# Patient Record
Sex: Male | Born: 1970
Health system: Southern US, Community
[De-identification: ages and names within clinical notes are randomized; demographics above are authoritative.]

## PROBLEM LIST (undated history)

## (undated) DIAGNOSIS — T7840XA Allergy, unspecified, initial encounter: Secondary | ICD-10-CM

## (undated) DIAGNOSIS — K219 Gastro-esophageal reflux disease without esophagitis: Secondary | ICD-10-CM

## (undated) HISTORY — DX: Allergy, unspecified, initial encounter: T78.40XA

## (undated) HISTORY — PX: CHOLECYSTECTOMY: SHX55

## (undated) HISTORY — DX: Gastro-esophageal reflux disease without esophagitis: K21.9

---

## 1999-05-29 ENCOUNTER — Emergency Department (HOSPITAL_COMMUNITY): Admission: EM | Admit: 1999-05-29 | Discharge: 1999-05-29 | Payer: Self-pay | Admitting: Emergency Medicine

## 2004-09-11 ENCOUNTER — Ambulatory Visit: Payer: Self-pay

## 2006-03-11 ENCOUNTER — Encounter: Payer: Self-pay | Admitting: Emergency Medicine

## 2010-01-09 DIAGNOSIS — J309 Allergic rhinitis, unspecified: Secondary | ICD-10-CM | POA: Insufficient documentation

## 2018-07-05 DIAGNOSIS — L573 Poikiloderma of Civatte: Secondary | ICD-10-CM | POA: Diagnosis not present

## 2018-07-05 DIAGNOSIS — D224 Melanocytic nevi of scalp and neck: Secondary | ICD-10-CM | POA: Diagnosis not present

## 2018-10-01 DIAGNOSIS — Z13220 Encounter for screening for lipoid disorders: Secondary | ICD-10-CM | POA: Diagnosis not present

## 2018-10-01 DIAGNOSIS — Z0000 Encounter for general adult medical examination without abnormal findings: Secondary | ICD-10-CM | POA: Diagnosis not present

## 2019-05-03 ENCOUNTER — Other Ambulatory Visit: Payer: Self-pay

## 2019-05-03 ENCOUNTER — Other Ambulatory Visit: Payer: Self-pay | Admitting: Family Medicine

## 2019-05-03 ENCOUNTER — Ambulatory Visit
Admission: RE | Admit: 2019-05-03 | Discharge: 2019-05-03 | Disposition: A | Discharge status: Home / Self Care | Payer: Self-pay | Source: Ambulatory Visit | Attending: Family Medicine | Admitting: Family Medicine

## 2019-05-03 DIAGNOSIS — M545 Low back pain, unspecified: Secondary | ICD-10-CM

## 2019-05-03 DIAGNOSIS — M5416 Radiculopathy, lumbar region: Secondary | ICD-10-CM | POA: Diagnosis not present

## 2019-08-12 DIAGNOSIS — E559 Vitamin D deficiency, unspecified: Secondary | ICD-10-CM | POA: Diagnosis not present

## 2019-08-12 DIAGNOSIS — M5441 Lumbago with sciatica, right side: Secondary | ICD-10-CM | POA: Diagnosis not present

## 2019-08-12 DIAGNOSIS — Z13220 Encounter for screening for lipoid disorders: Secondary | ICD-10-CM | POA: Diagnosis not present

## 2019-08-12 DIAGNOSIS — Z125 Encounter for screening for malignant neoplasm of prostate: Secondary | ICD-10-CM | POA: Diagnosis not present

## 2019-08-12 DIAGNOSIS — Z0000 Encounter for general adult medical examination without abnormal findings: Secondary | ICD-10-CM | POA: Diagnosis not present

## 2019-09-01 ENCOUNTER — Other Ambulatory Visit: Payer: Self-pay | Admitting: Family Medicine

## 2019-09-01 ENCOUNTER — Ambulatory Visit
Admission: RE | Admit: 2019-09-01 | Discharge: 2019-09-01 | Disposition: A | Discharge status: Home / Self Care | Payer: BC Managed Care – PPO | Source: Ambulatory Visit | Attending: Family Medicine | Admitting: Family Medicine

## 2019-09-01 DIAGNOSIS — M5441 Lumbago with sciatica, right side: Secondary | ICD-10-CM

## 2019-09-01 DIAGNOSIS — M545 Low back pain: Secondary | ICD-10-CM | POA: Diagnosis not present

## 2020-05-29 DIAGNOSIS — M6740 Ganglion, unspecified site: Secondary | ICD-10-CM | POA: Diagnosis not present

## 2020-05-29 DIAGNOSIS — Z1211 Encounter for screening for malignant neoplasm of colon: Secondary | ICD-10-CM | POA: Diagnosis not present

## 2020-05-29 DIAGNOSIS — R1011 Right upper quadrant pain: Secondary | ICD-10-CM | POA: Diagnosis not present

## 2020-05-30 ENCOUNTER — Other Ambulatory Visit: Payer: Self-pay | Admitting: Family Medicine

## 2020-05-30 DIAGNOSIS — R1011 Right upper quadrant pain: Secondary | ICD-10-CM

## 2020-06-06 ENCOUNTER — Ambulatory Visit
Admission: RE | Admit: 2020-06-06 | Discharge: 2020-06-06 | Disposition: A | Discharge status: Home / Self Care | Payer: BC Managed Care – PPO | Source: Ambulatory Visit | Attending: Family Medicine | Admitting: Family Medicine

## 2020-06-06 ENCOUNTER — Other Ambulatory Visit: Payer: Self-pay

## 2020-06-06 DIAGNOSIS — K8020 Calculus of gallbladder without cholecystitis without obstruction: Secondary | ICD-10-CM | POA: Diagnosis not present

## 2020-06-06 DIAGNOSIS — R1011 Right upper quadrant pain: Secondary | ICD-10-CM

## 2020-06-06 DIAGNOSIS — Q600 Renal agenesis, unilateral: Secondary | ICD-10-CM | POA: Diagnosis not present

## 2020-06-06 DIAGNOSIS — N281 Cyst of kidney, acquired: Secondary | ICD-10-CM | POA: Diagnosis not present

## 2020-06-13 ENCOUNTER — Other Ambulatory Visit: Payer: Self-pay

## 2020-06-13 ENCOUNTER — Ambulatory Visit (INDEPENDENT_AMBULATORY_CARE_PROVIDER_SITE_OTHER): Payer: BC Managed Care – PPO | Admitting: Podiatry

## 2020-06-13 ENCOUNTER — Ambulatory Visit (INDEPENDENT_AMBULATORY_CARE_PROVIDER_SITE_OTHER): Payer: BC Managed Care – PPO

## 2020-06-13 ENCOUNTER — Encounter: Payer: Self-pay | Admitting: Podiatry

## 2020-06-13 VITALS — BP 136/81 | HR 65 | Temp 98.3°F | Resp 16

## 2020-06-13 DIAGNOSIS — M67471 Ganglion, right ankle and foot: Secondary | ICD-10-CM

## 2020-06-13 NOTE — Progress Notes (Signed)
  Subjective:  Patient ID: Jorge Rush, male    DOB: 1971-02-14,  MRN: [id number] HPI Chief Complaint  Patient presents with  . Foot Pain    "A little knot came up on my right ankle.  It showed up three to four months ago.  It doesn't really bother me.  I noticed it when I started to wear my steel toe shoes.  I loosened the strings up, that seemed to help."    49 y.o. male presents with the above complaint.   ROS: Denies fever chills nausea vomiting muscle aches pains calf pain back pain chest pain shortness of breath.  No past medical history on file.  No current outpatient medications on file.  Allergies  Allergen Reactions  . Sulfa Antibiotics Rash   Review of Systems Objective:   Vitals:   06/13/20 1632  BP: 136/81  Pulse: 65  Resp: 16  Temp: 98.3 F (36.8 C)    General: Well developed, nourished, in no acute distress, alert and oriented x3   Dermatological: Skin is warm, dry and supple bilateral. Nails x 10 are well maintained; remaining integument appears unremarkable at this time. There are no open sores, no preulcerative lesions, no rash or signs of infection present.  Soft tissue mass measuring a little bit greater than 1 cm in diameter just overlying the sinus tarsi and overlying that is the intermediate dorsal cutaneous nerve.  This appeared to be fluctuant nonpulsatile mass.  Vascular: Dorsalis Pedis artery and Posterior Tibial artery pedal pulses are 2/4 bilateral with immedate capillary fill time. Pedal hair growth present. No varicosities and no lower extremity edema present bilateral.   Neruologic: Grossly intact via light touch bilateral. Vibratory intact via tuning fork bilateral. Protective threshold with Semmes Wienstein monofilament intact to all pedal sites bilateral. Patellar and Achilles deep tendon reflexes 2+ bilateral. No Babinski or clonus noted bilateral.   Musculoskeletal: No gross boney pedal deformities bilateral. No pain, crepitus, or  limitation noted with foot and ankle range of motion bilateral. Muscular strength 5/5 in all groups tested bilateral.  Gait: Unassisted, Nonantalgic.    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  It appears that there may have been damage to the subtalar joint at some point anteriorly.  No calcification of the mass on x-ray. Assessment & Plan:   Assessment: Ganglion cyst anterior lateral ankle superior to the subtalar joint sinus tarsi area.  Plan: Discussed etiology pathology conservative versus surgical therapies.  At this point after a small amount of local anesthetic was injected I was able to aspirate just a little more than 1 cc of normal appearing joint fluid.  I then introduced 10 mg of Kenalog and a dry sterile compressive dressing.     Kemaya Dorner T. [place], [place]

## 2020-07-20 DIAGNOSIS — K8020 Calculus of gallbladder without cholecystitis without obstruction: Secondary | ICD-10-CM | POA: Diagnosis not present

## 2020-08-29 DIAGNOSIS — Z0000 Encounter for general adult medical examination without abnormal findings: Secondary | ICD-10-CM | POA: Diagnosis not present

## 2020-08-29 DIAGNOSIS — Z13220 Encounter for screening for lipoid disorders: Secondary | ICD-10-CM | POA: Diagnosis not present

## 2020-08-29 DIAGNOSIS — Z125 Encounter for screening for malignant neoplasm of prostate: Secondary | ICD-10-CM | POA: Diagnosis not present

## 2020-08-29 DIAGNOSIS — K8020 Calculus of gallbladder without cholecystitis without obstruction: Secondary | ICD-10-CM | POA: Diagnosis not present

## 2021-01-15 DIAGNOSIS — Z01818 Encounter for other preprocedural examination: Secondary | ICD-10-CM | POA: Diagnosis not present

## 2021-01-16 DIAGNOSIS — K8020 Calculus of gallbladder without cholecystitis without obstruction: Secondary | ICD-10-CM | POA: Diagnosis not present

## 2021-01-18 DIAGNOSIS — K8020 Calculus of gallbladder without cholecystitis without obstruction: Secondary | ICD-10-CM | POA: Diagnosis not present

## 2021-01-18 DIAGNOSIS — K8010 Calculus of gallbladder with chronic cholecystitis without obstruction: Secondary | ICD-10-CM | POA: Diagnosis not present

## 2021-03-17 DIAGNOSIS — Z20822 Contact with and (suspected) exposure to covid-19: Secondary | ICD-10-CM | POA: Diagnosis not present

## 2021-06-16 IMAGING — DX DG LUMBAR SPINE COMPLETE 4+V
5 series · 5 of 5 positions shown · non-contrast
Comparison: May 03, 2019

CLINICAL DATA: Lumbago with right-sided sciatica

EXAM:
LUMBAR SPINE - COMPLETE 4+ VIEW

[dg lumbar spine complete 4 +v (1 of 5)]
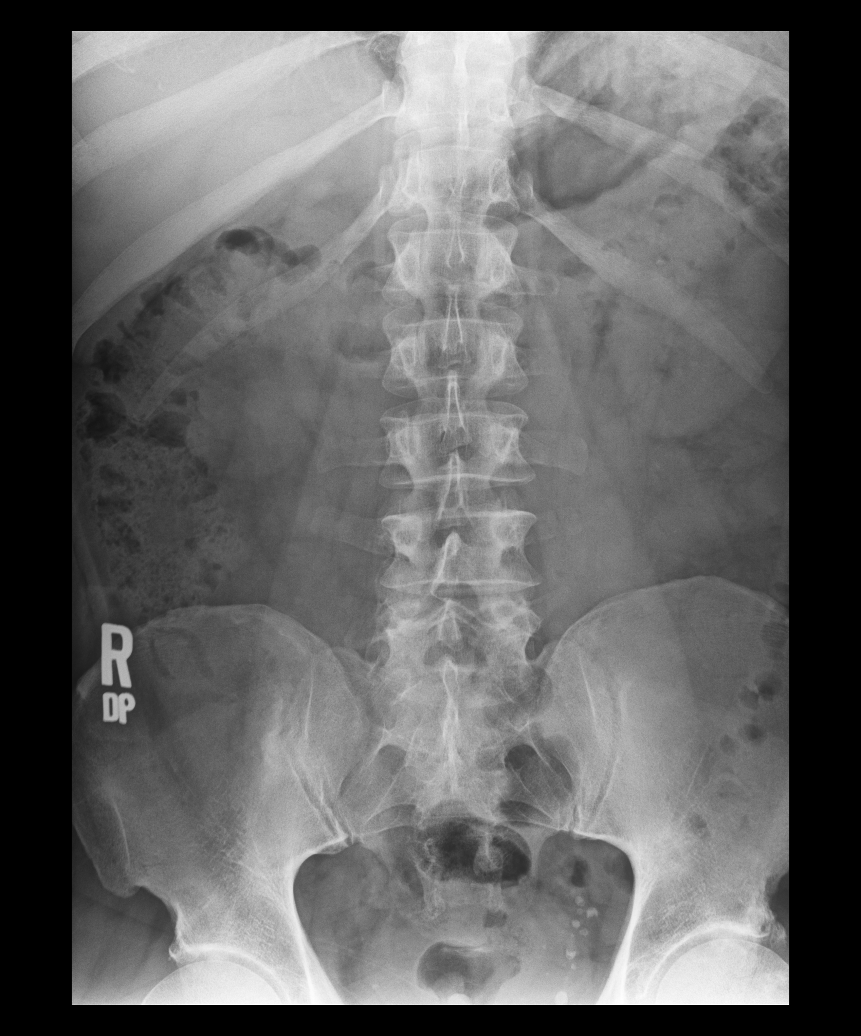

[dg lumbar spine complete 4 +v (2 of 5)]
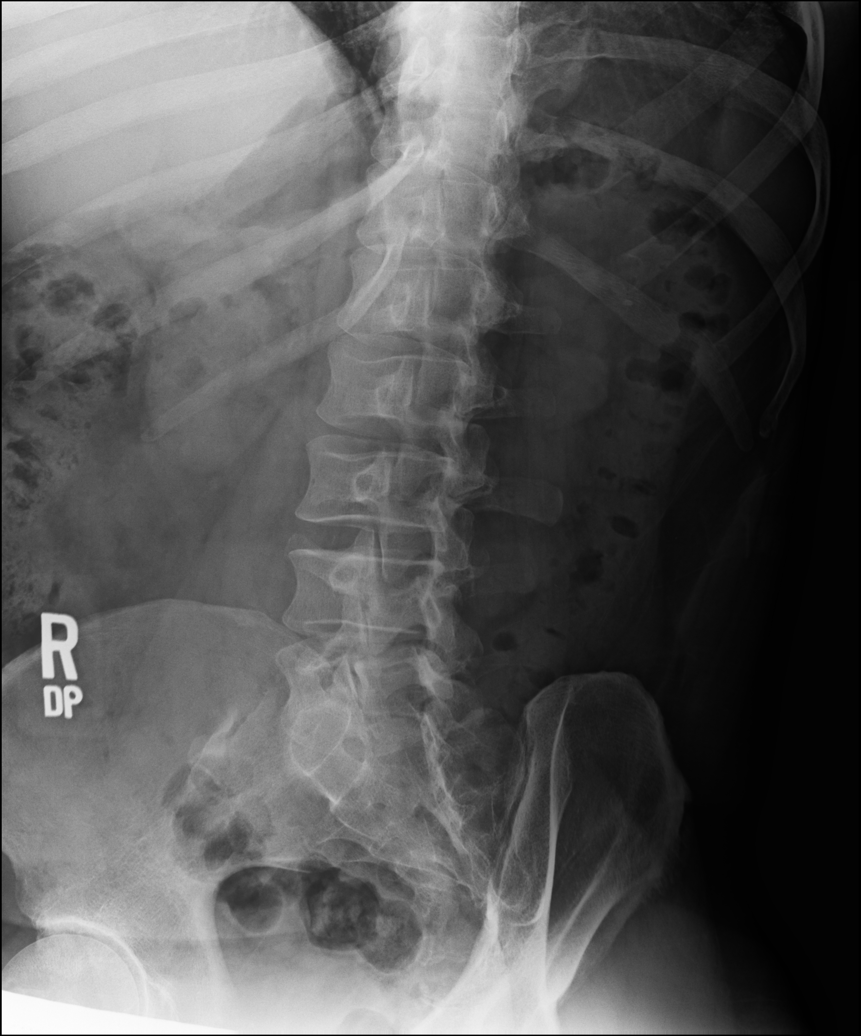

[dg lumbar spine complete 4 +v (3 of 5)]
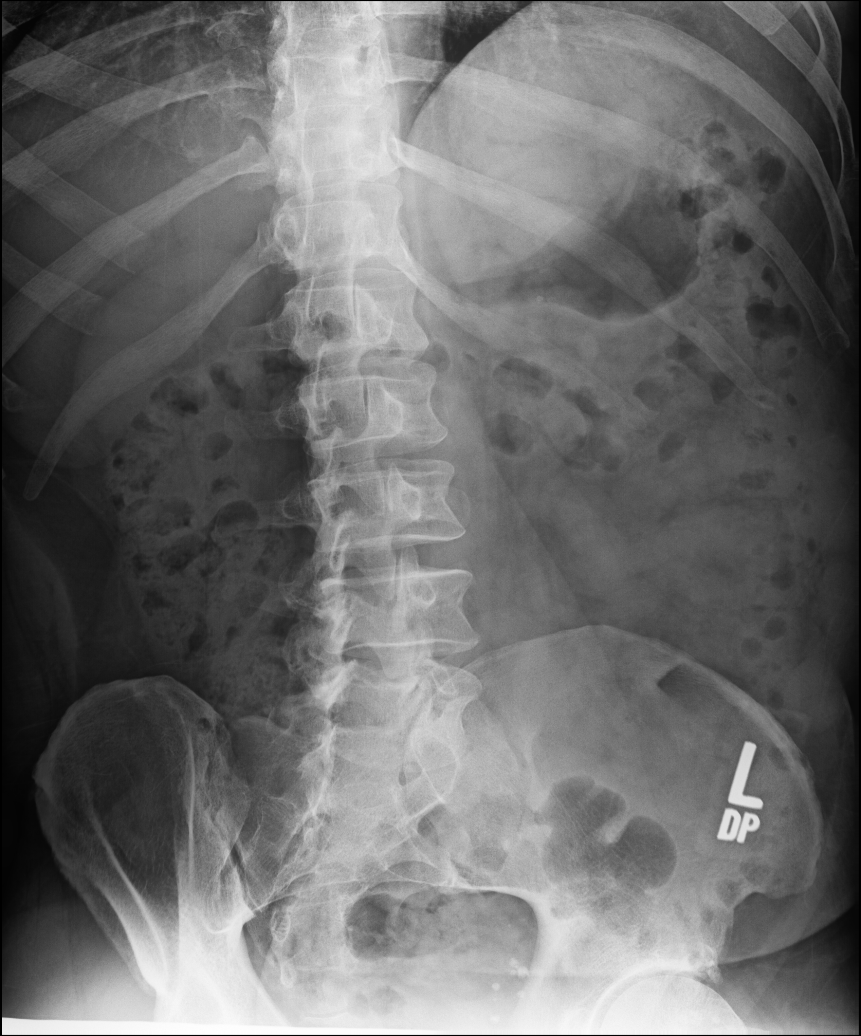

[dg lumbar spine complete 4 +v (4 of 5)]
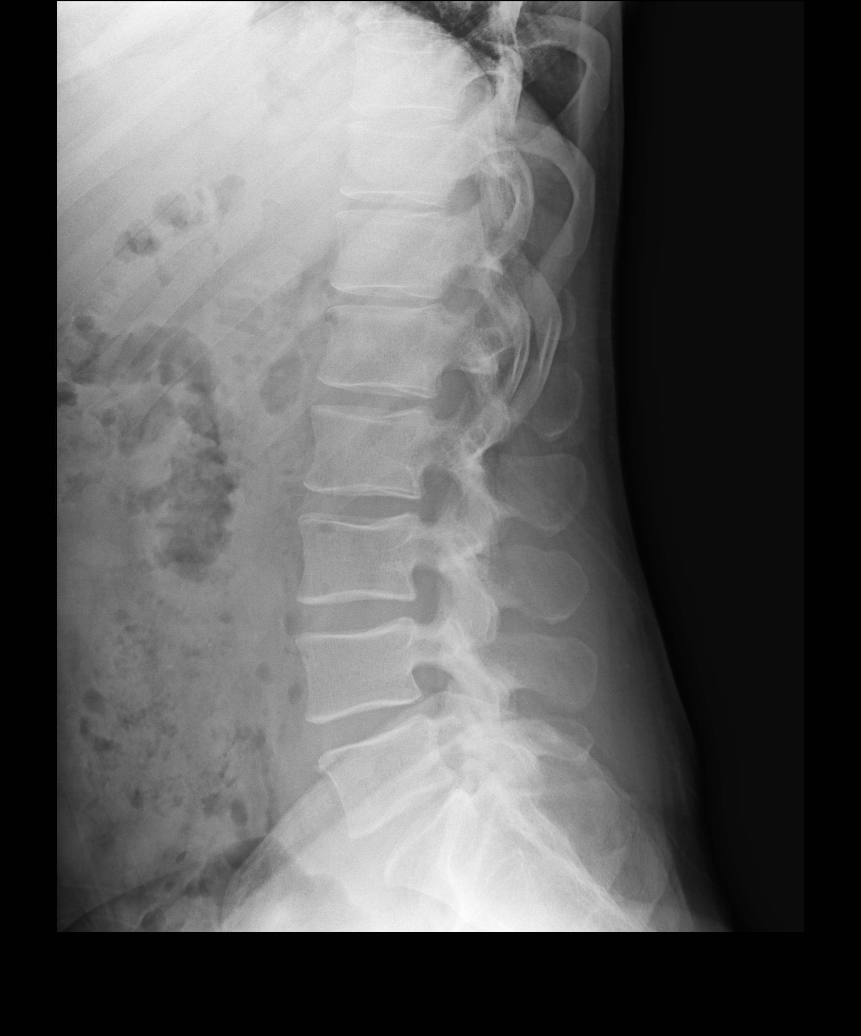

[dg lumbar spine complete 4 +v (5 of 5)]
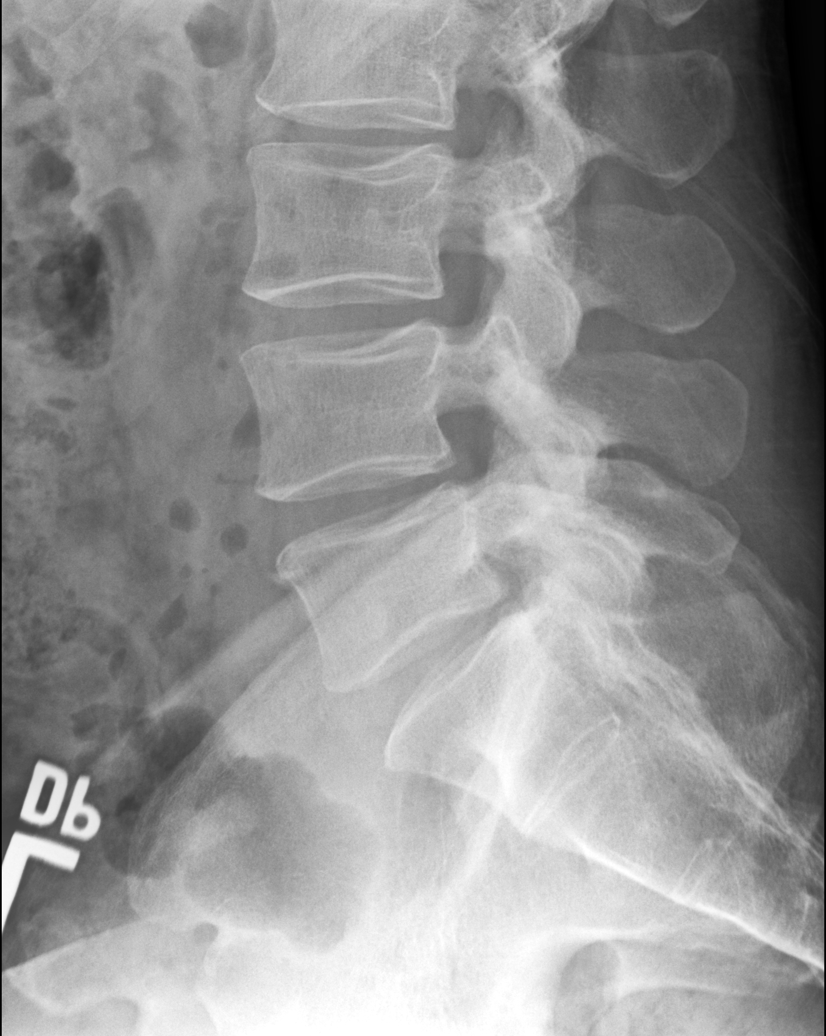

[5 of 5 positions shown; findings below may reference images not displayed]

FINDINGS: Frontal, lateral, spot lumbosacral lateral, and bilateral oblique
views were obtained. There are 5 non-rib-bearing lumbar type
vertebral bodies. There is no evident fracture or spondylolisthesis.
Disc spaces appear unremarkable. There is a pars defect on the right
at L5-S1, also present on prior study. No appreciable facet
arthropathy evident.
IMPRESSION: There is a pars defect at L5 on the right without spondylolisthesis.
No fracture. No appreciable underlying arthropathy.

## 2021-09-10 DIAGNOSIS — Z0000 Encounter for general adult medical examination without abnormal findings: Secondary | ICD-10-CM | POA: Diagnosis not present

## 2021-09-10 DIAGNOSIS — Z13220 Encounter for screening for lipoid disorders: Secondary | ICD-10-CM | POA: Diagnosis not present

## 2021-09-10 DIAGNOSIS — Z125 Encounter for screening for malignant neoplasm of prostate: Secondary | ICD-10-CM | POA: Diagnosis not present

## 2022-03-22 IMAGING — US US ABDOMEN COMPLETE
1 series · 13 of 25 positions shown · non-contrast
Comparison: None

CLINICAL DATA: RIGHT upper quadrant pain for 1 week after eating
fatty foods

EXAM:
ABDOMEN ULTRASOUND COMPLETE

[Series 1: us abdomen complete · 0.25mm/px · 13 of 112 slices shown]
[im 1/112]
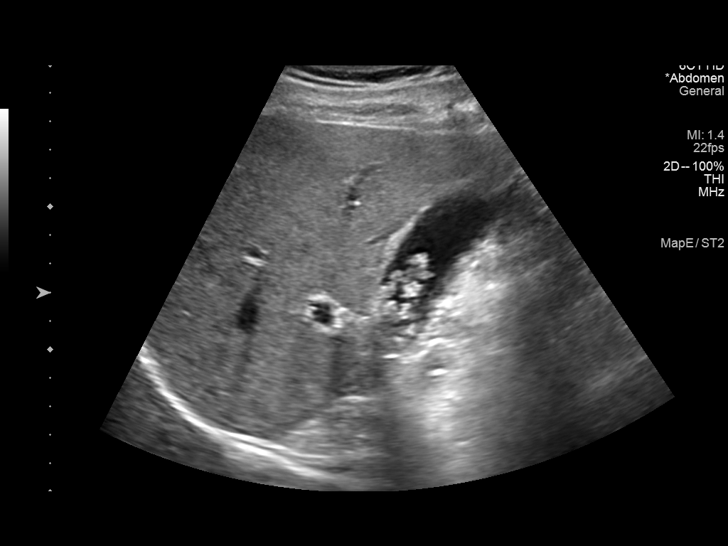
[im 10/112]
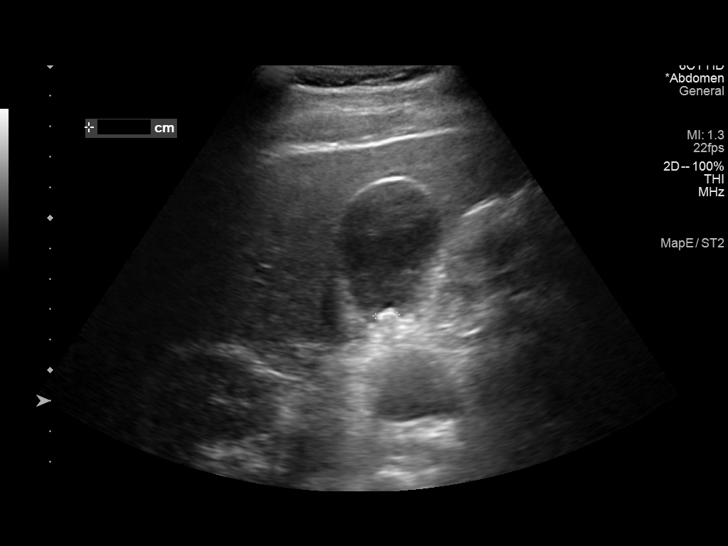
[im 19/112]
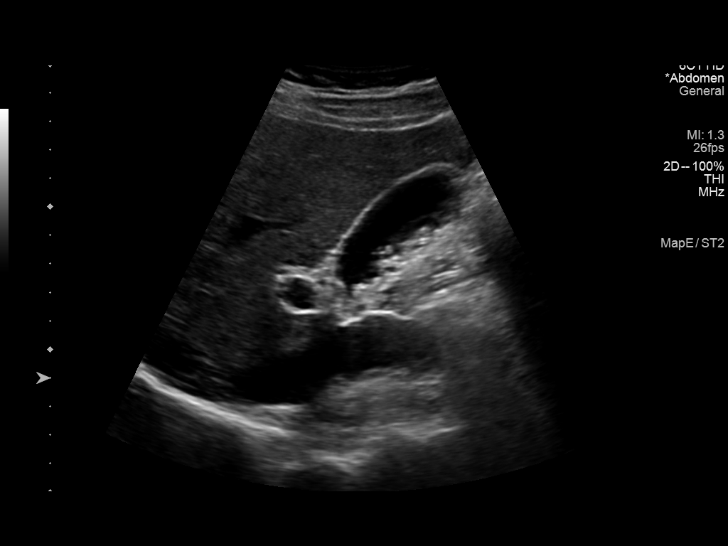
[im 28/112]
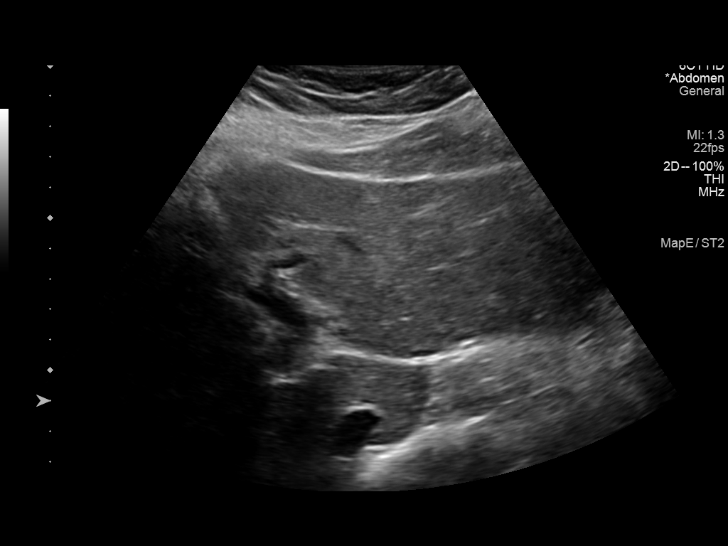
[im 38/112]
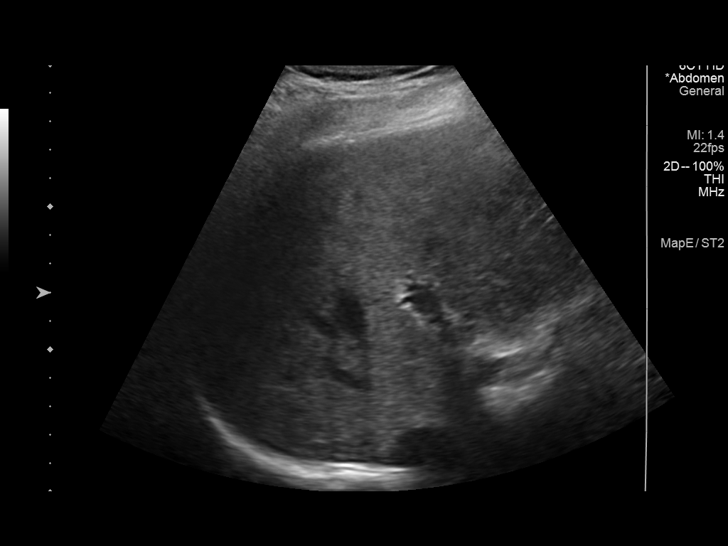
[im 47/112]
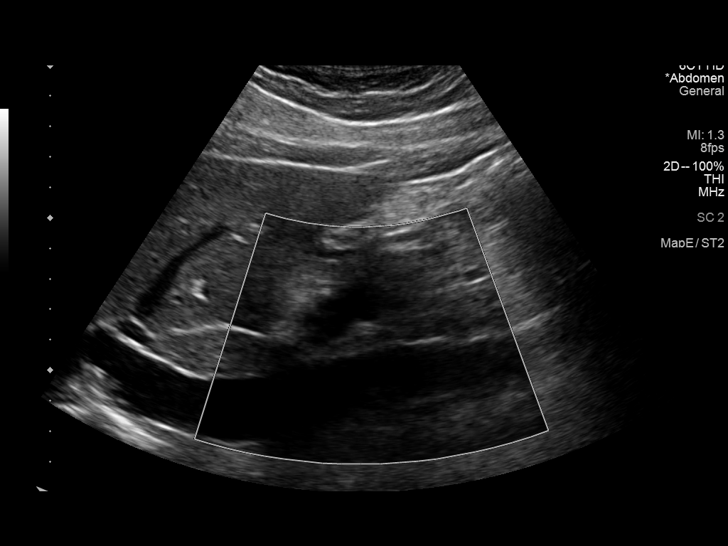
[im 56/112]
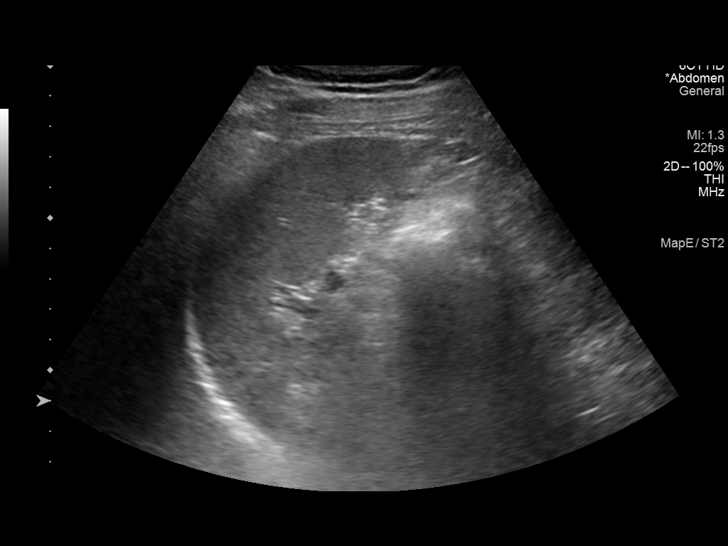
[im 65/112]
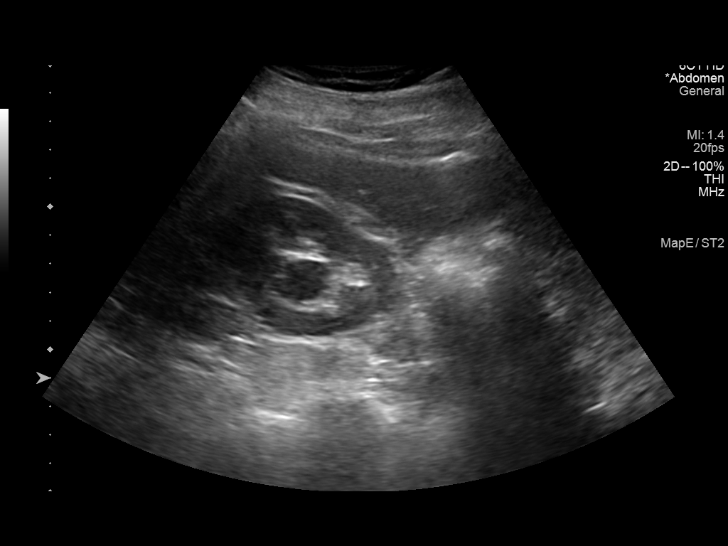
[im 75/112]
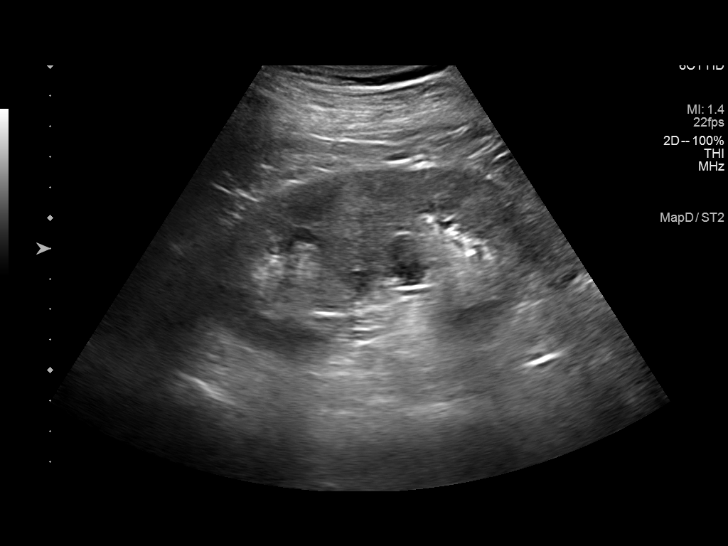
[im 84/112]
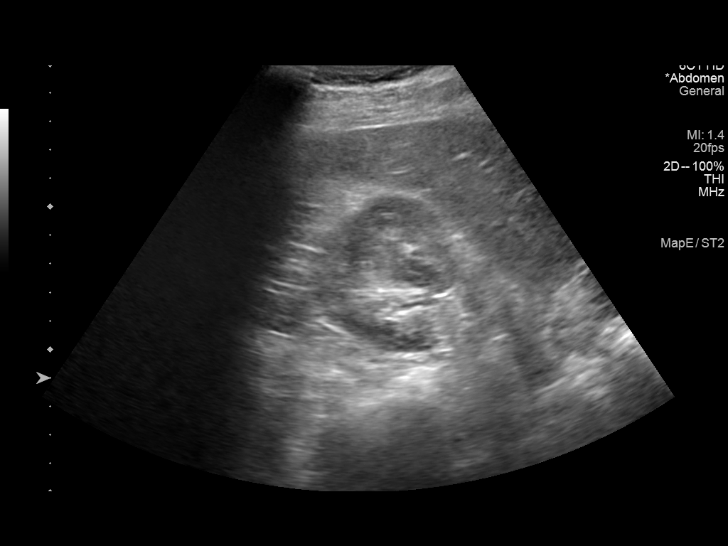
[im 93/112]
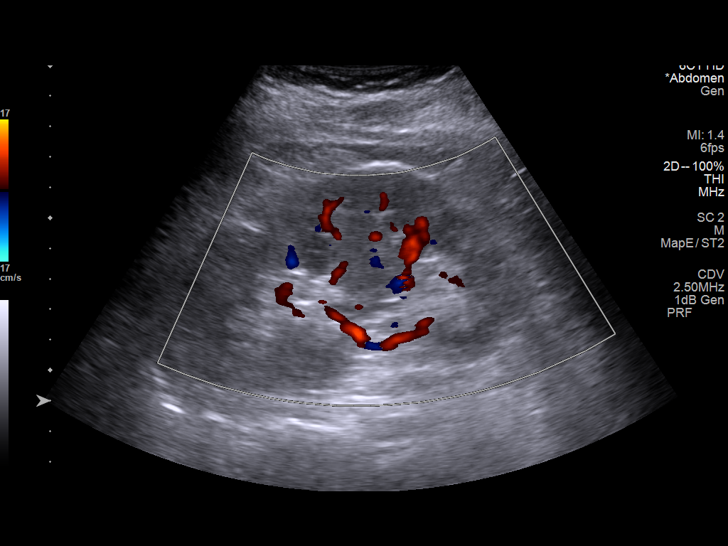
[im 102/112]
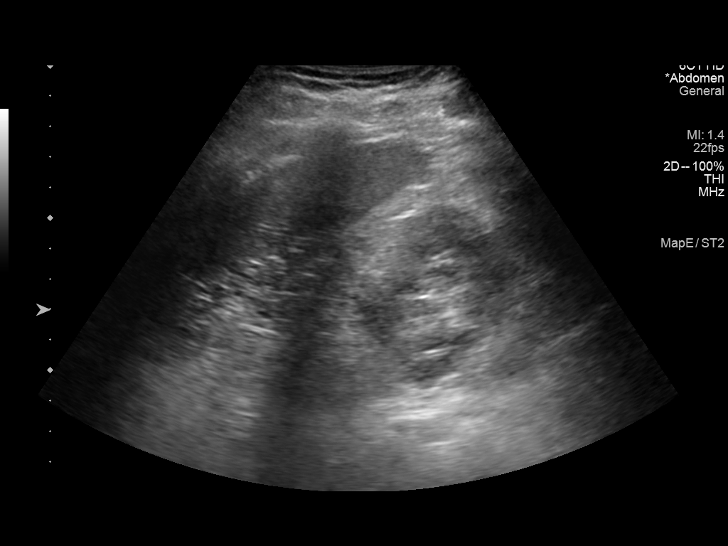
[im 112/112]
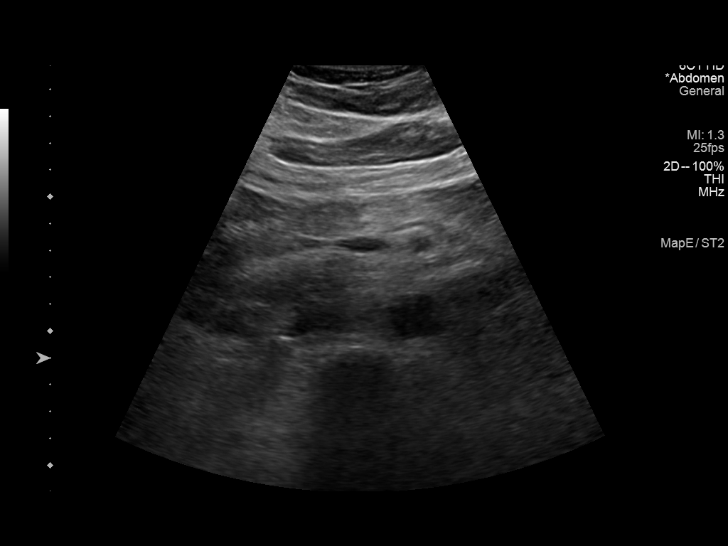

[13 of 25 positions shown; findings below may reference images not displayed]

FINDINGS: Gallbladder: Multiple shadowing calculi in gallbladder up to 7 mm
diameter. No gallbladder wall thickening, pericholecystic fluid or
sonographic Murphy sign

Common bile duct: Diameter: 3 mm, normal

Liver: Normal echogenicity without mass or nodularity. No
intrahepatic biliary dilatation. Portal vein is patent on color
Doppler imaging with normal direction of blood flow towards the
liver.

IVC: Normal appearance

Pancreas: Portions of head and distal tail obscured by bowel gas,
visualized portions normal appearance

Spleen: Normal appearance, 7.5 cm length

Right Kidney: Length: 11.9 cm. Normal cortical thickness and
echogenicity. Peripelvic cyst mid kidney 1.7 x 1.7 x 1.5 cm. No
additional masses or hydronephrosis.

Left Kidney: Length: 12.6 cm. Normal morphology without mass or
hydronephrosis.

Abdominal aorta: Normal caliber

Other findings: No free fluid
IMPRESSION: Cholelithiasis without evidence acute cholecystitis.

1.7 cm peripelvic cyst mid RIGHT kidney.

Incomplete pancreatic visualization.

Remainder of exam unremarkable.

## 2022-05-26 DIAGNOSIS — K635 Polyp of colon: Secondary | ICD-10-CM | POA: Diagnosis not present

## 2022-05-26 DIAGNOSIS — Z1211 Encounter for screening for malignant neoplasm of colon: Secondary | ICD-10-CM | POA: Diagnosis not present

## 2022-05-26 DIAGNOSIS — K5730 Diverticulosis of large intestine without perforation or abscess without bleeding: Secondary | ICD-10-CM | POA: Diagnosis not present

## 2022-05-26 DIAGNOSIS — K648 Other hemorrhoids: Secondary | ICD-10-CM | POA: Diagnosis not present

## 2022-07-03 NOTE — Progress Notes (Unsigned)
   I,Jana Rithwik Schmieg,acting as a Neurosurgeon for [employer], PA-C.,have documented all relevant documentation on the behalf of Debera Lat, PA-C,as directed by  [employer], PA-C while in the presence of [employer], PA-C.  New Patient Office Visit  Subjective    Patient ID: Jorge Rush, male    DOB: December 23, 1970  Age: 51 y.o. MRN: [id number]  CC:  Chief Complaint  Patient presents with   Establish Care    HPI Jorge Rush is a 51 yr old male presenting to establish care.   Wife scheduled appt for patient due to his snoring.  Feels tired and ahs been having puffiness  Patient points to area at upper abdomin that he states "feels weird." Patient can't describe the feeling.  Occurs off and on and mostly in the morning.  States he had a colonoscopy in July '23 and was dx with diverticulitis. Wonders if related.  Otherwise, patient states he feels great.  Cholestectomy last year  No outpatient encounter medications on file as of 07/04/2022.   No facility-administered encounter medications on file as of 07/04/2022.    History reviewed. No pertinent past medical history.  Past Surgical History:  Procedure Laterality Date   CHOLECYSTECTOMY      Family History  Problem Relation Age of Onset   Hypertension Mother    Breast cancer Maternal Grandmother    Heart disease Maternal Grandfather     Social History   Socioeconomic History   Marital status: Married    Spouse name: Not on file   Number of children: Not on file   Years of education: Not on file   Highest education level: Not on file  Occupational History   Not on file  Tobacco Use   Smoking status: Every Day    Packs/day: 0.50    Types: Cigarettes   Smokeless tobacco: Never  Substance and Sexual Activity   Alcohol use: Yes    Alcohol/week: 26.0 standard drinks of alcohol    Types: 3 Cans of beer, 10 Shots of liquor, 13 Standard drinks or equivalent per week    Comment: OCCASIONALLY   Drug use: Yes    Types:  Marijuana    Comment: OCCASIONALLY   Sexual activity: Not on file  Other Topics Concern   Not on file  Social History Narrative   Not on file   Social Determinants of Health   Financial Resource Strain: Not on file  Food Insecurity: Not on file  Transportation Needs: Not on file  Physical Activity: Not on file  Stress: Not on file  Social Connections: Not on file  Intimate Partner Violence: Not on file    Review of Systems  Endo/Heme/Allergies:  Positive for environmental allergies.  All other systems reviewed and are negative.       Objective    BP 127/77 (BP Location: Right Arm, Patient Position: Sitting, Cuff Size: Normal)   Pulse 67   Temp 98.5 F (36.9 C) (Oral)   Resp 16   Ht 5\' 10"  (1.778 m)   Wt 187 lb 4.8 oz (85 kg)   SpO2 97%   BMI 26.87 kg/m   Physical Exam  {Labs (Optional):23779}    Assessment & Plan:   Problem List Items Addressed This Visit   None Visit Diagnoses     Establishing care with new doctor, encounter for    -  Primary       No follow-ups on file.   , CMA

## 2022-07-04 ENCOUNTER — Ambulatory Visit (INDEPENDENT_AMBULATORY_CARE_PROVIDER_SITE_OTHER): Payer: BC Managed Care – PPO | Admitting: Physician Assistant

## 2022-07-04 ENCOUNTER — Encounter: Payer: Self-pay | Admitting: Physician Assistant

## 2022-07-04 VITALS — BP 127/77 | HR 67 | Temp 98.5°F | Resp 16 | Ht 70.0 in | Wt 187.3 lb

## 2022-07-04 DIAGNOSIS — F17200 Nicotine dependence, unspecified, uncomplicated: Secondary | ICD-10-CM

## 2022-07-04 DIAGNOSIS — R0683 Snoring: Secondary | ICD-10-CM

## 2022-07-04 DIAGNOSIS — R5383 Other fatigue: Secondary | ICD-10-CM

## 2022-07-04 DIAGNOSIS — Z7689 Persons encountering health services in other specified circumstances: Secondary | ICD-10-CM

## 2022-07-04 DIAGNOSIS — K219 Gastro-esophageal reflux disease without esophagitis: Secondary | ICD-10-CM

## 2022-07-04 DIAGNOSIS — R1031 Right lower quadrant pain: Secondary | ICD-10-CM

## 2022-07-04 MED ORDER — OMEPRAZOLE 20 MG PO CPDR: 20.0000 mg | DELAYED_RELEASE_CAPSULE | Freq: Every day | ORAL | 3 refills | Status: DC

## 2022-07-05 LAB — LIPID PANEL
Chol/HDL Ratio: 4.5 ratio (ref 0.0–5.0)
Cholesterol, Total: 165 mg/dL (ref 100–199)
HDL: 37 mg/dL — ABNORMAL LOW (ref >39)
LDL Chol Calc (NIH): 101 mg/dL — ABNORMAL HIGH (ref 0–99)
Triglycerides: 150 mg/dL — ABNORMAL HIGH (ref 0–149)
VLDL Cholesterol Cal: 27 mg/dL (ref 5–40)

## 2022-07-05 LAB — CBC WITH DIFFERENTIAL/PLATELET
Basophils Absolute: 0.1 10*3/uL (ref 0.0–0.2)
Basos: 1 %
EOS (ABSOLUTE): 0.4 10*3/uL (ref 0.0–0.4)
Eos: 7 %
Hematocrit: 44.8 % (ref 37.5–51.0)
Hemoglobin: 14.8 g/dL (ref 13.0–17.7)
Immature Grans (Abs): 0 10*3/uL (ref 0.0–0.1)
Immature Granulocytes: 0 %
Lymphocytes Absolute: 1.9 10*3/uL (ref 0.7–3.1)
Lymphs: 33 %
MCH: 28.1 pg (ref 26.6–33.0)
MCHC: 33 g/dL (ref 31.5–35.7)
MCV: 85 fL (ref 79–97)
Monocytes Absolute: 0.4 10*3/uL (ref 0.1–0.9)
Monocytes: 7 %
Neutrophils Absolute: 3.1 10*3/uL (ref 1.4–7.0)
Neutrophils: 52 %
Platelets: 253 10*3/uL (ref 150–450)
RBC: 5.26 x10E6/uL (ref 4.14–5.80)
RDW: 12.8 % (ref 11.6–15.4)
WBC: 5.9 10*3/uL (ref 3.4–10.8)

## 2022-07-05 LAB — COMPREHENSIVE METABOLIC PANEL
ALT: 19 IU/L (ref 0–44)
AST: 17 IU/L (ref 0–40)
Albumin/Globulin Ratio: 1.7 (ref 1.2–2.2)
Albumin: 4.7 g/dL (ref 3.8–4.9)
Alkaline Phosphatase: 82 IU/L (ref 44–121)
BUN/Creatinine Ratio: 18 (ref 9–20)
BUN: 15 mg/dL (ref 6–24)
Bilirubin Total: 0.5 mg/dL (ref 0.0–1.2)
CO2: 22 mmol/L (ref 20–29)
Calcium: 9.4 mg/dL (ref 8.7–10.2)
Chloride: 104 mmol/L (ref 96–106)
Creatinine, Ser: 0.85 mg/dL (ref 0.76–1.27)
Globulin, Total: 2.7 g/dL (ref 1.5–4.5)
Glucose: 92 mg/dL (ref 70–99)
Potassium: 4.2 mmol/L (ref 3.5–5.2)
Sodium: 141 mmol/L (ref 134–144)
Total Protein: 7.4 g/dL (ref 6.0–8.5)
eGFR: 105 mL/min/{1.73_m2} (ref >59)

## 2022-07-05 LAB — TSH: TSH: 1.58 u[IU]/mL (ref 0.450–4.500)

## 2022-08-08 ENCOUNTER — Ambulatory Visit (INDEPENDENT_AMBULATORY_CARE_PROVIDER_SITE_OTHER): Payer: BC Managed Care – PPO | Admitting: Physician Assistant

## 2022-08-08 ENCOUNTER — Encounter: Payer: Self-pay | Admitting: Physician Assistant

## 2022-08-08 VITALS — BP 106/67 | HR 68 | Resp 16 | Wt 189.0 lb

## 2022-08-08 DIAGNOSIS — F17200 Nicotine dependence, unspecified, uncomplicated: Secondary | ICD-10-CM

## 2022-08-08 DIAGNOSIS — K219 Gastro-esophageal reflux disease without esophagitis: Secondary | ICD-10-CM

## 2022-08-08 NOTE — Progress Notes (Signed)
Established patient visit  I,April Miller,acting as a scribe for [employer], PA-C.,have documented all relevant documentation on the behalf of Mardene Speak, PA-C,as directed by  [employer], PA-C while in the presence of [employer], PA-C.   Patient: Jorge Rush   DOB: 08/10/1971   51 y.o. Male  MRN: [id number] Visit Date: 08/08/2022  Today's healthcare provider: Mardene Speak, PA-C   Chief Complaint  Patient presents with   Follow-up   Gastroesophageal Reflux   Subjective    HPI  GERD, Follow up:  The patient was last seen for GERD 1 months ago. Changes made since that visit include; started omeprazole 20 mg qd.  He reports good compliance with treatment. He is not having side effects. none.  He IS experiencing belching. He is NOT experiencing none  ----------------------------------------------------------------------------------------- Patient has not started omeprazole. Patient has still been having belching after he eats. Patient has also been having discomfort under his right shoulder blade when he moves a certain way.  Tobacco use daily up to 8-10 cigarettes Alcohol use: weekly up to 10 - 14 drinks   Medications: Outpatient Medications Prior to Visit  Medication Sig   omeprazole (PRILOSEC) 20 MG capsule Take 1 capsule (20 mg total) by mouth daily. (Patient not taking: Reported on 08/08/2022)   No facility-administered medications prior to visit.    Review of Systems  Constitutional:  Negative for appetite change, chills and fever.  Respiratory:  Negative for chest tightness, shortness of breath and wheezing.   Cardiovascular:  Negative for chest pain and palpitations.  Gastrointestinal:  Negative for abdominal pain, nausea and vomiting.       Objective    BP 106/67 (BP Location: Right Arm, Patient Position: Sitting, Cuff Size: Large)   Pulse 68   Resp 16   Wt 189 lb (85.7 kg)   SpO2 99%   BMI 27.12 kg/m    Physical Exam Vitals  reviewed.  Constitutional:      General: He is not in acute distress.    Appearance: Normal appearance. He is not diaphoretic.  HENT:     Head: Normocephalic and atraumatic.  Eyes:     General: No scleral icterus.    Extraocular Movements: Extraocular movements intact.     Conjunctiva/sclera: Conjunctivae normal.     Pupils: Pupils are equal, round, and reactive to light.  Cardiovascular:     Rate and Rhythm: Normal rate and regular rhythm.     Pulses: Normal pulses.     Heart sounds: Normal heart sounds. No murmur heard. Pulmonary:     Effort: Pulmonary effort is normal. No respiratory distress.     Breath sounds: Normal breath sounds. No wheezing or rhonchi.  Abdominal:     General: Abdomen is flat. Bowel sounds are normal. There is distension (mild).     Palpations: Abdomen is soft. There is no mass.     Tenderness: There is no right CVA tenderness, left CVA tenderness or rebound.     Hernia: No hernia is present.  Musculoskeletal:        General: Tenderness (right-sided back pain) present.     Cervical back: Neck supple.     Right lower leg: No edema.     Left lower leg: No edema.  Lymphadenopathy:     Cervical: No cervical adenopathy.  Skin:    General: Skin is warm and dry.     Findings: No rash.  Neurological:     Mental Status: He is  alert and oriented to person, place, and time. Mental status is at baseline.  Psychiatric:        Mood and Affect: Mood normal.        Behavior: Behavior normal.        Thought Content: Thought content normal.        Judgment: Judgment normal.      No results found for any visits on 08/08/22.  Assessment & Plan     1. Gastroesophageal reflux disease without esophagitis Recommended to start on omeprazole up to BID Elevate the head of the bed 6-8 inches, avoid recumbency for 3 hours after eating, avoid food as a delayed gastric emptying, weight loss    2. Smoking Pt is willing to quit smoking Will reassess his resolve at his FU  appt  3. Risky drinking of drinking >14 drinks a week? Positive audit-c test with >4 score Will reassess at FU  FU in 2 weeks CPE in [facility] requirement     The patient was advised to call back or seek an in-person evaluation if the symptoms worsen or if the condition fails to improve as anticipated.  I discussed the assessment and treatment plan with the patient. The patient was provided an opportunity to ask questions and all were answered. The patient agreed with the plan and demonstrated an understanding of the instructions.  The entirety of the information documented in the History of Present Illness, Review of Systems and Physical Exam were personally obtained by me. Portions of this information were initially documented by the CMA and reviewed by me for thoroughness and accuracy.  Portions of this note were created using dictation software and may contain typographical errors.     Mardene Speak, PA-C  [facility] [phone] (phone) [phone] (fax)  [place]

## 2022-08-22 ENCOUNTER — Ambulatory Visit (INDEPENDENT_AMBULATORY_CARE_PROVIDER_SITE_OTHER): Payer: BC Managed Care – PPO | Admitting: Physician Assistant

## 2022-08-22 VITALS — BP 115/81 | Wt 187.0 lb

## 2022-08-22 DIAGNOSIS — E663 Overweight: Secondary | ICD-10-CM | POA: Diagnosis not present

## 2022-08-22 DIAGNOSIS — K219 Gastro-esophageal reflux disease without esophagitis: Secondary | ICD-10-CM

## 2022-08-22 DIAGNOSIS — F17200 Nicotine dependence, unspecified, uncomplicated: Secondary | ICD-10-CM

## 2022-08-22 DIAGNOSIS — R0683 Snoring: Secondary | ICD-10-CM | POA: Diagnosis not present

## 2022-08-22 MED ORDER — OMEPRAZOLE 20 MG PO CPDR: 20.0000 mg | DELAYED_RELEASE_CAPSULE | Freq: Every day | ORAL | 3 refills | Status: DC

## 2022-08-22 NOTE — Progress Notes (Unsigned)
   Argentina Ponder De[place],acting as a Education administrator for [employer], PA-C.,have documented all relevant documentation on the behalf of Mardene Speak, PA-C,as directed by  [employer], PA-C while in the presence of [employer], PA-C.     Established patient visit   Patient: Jorge Rush   DOB: 07/19/1971   51 y.o. Male  MRN: [id number] Visit Date: 08/22/2022  Today's healthcare provider: Mardene Speak, PA-C   No chief complaint on file.  Subjective    HPI  The patient is a 51 year old male who presents for follow up of GERD.  Patient was seen 2 weeks ago and given Omeprazole.  Patient admits that he has not been taking the medication regularly.  He has noticed some improvement but still having trouble, mostly in the am.    Medications: Outpatient Medications Prior to Visit  Medication Sig   omeprazole (PRILOSEC) 20 MG capsule Take 1 capsule (20 mg total) by mouth daily. (Patient not taking: Reported on 08/08/2022)   No facility-administered medications prior to visit.    Review of Systems  Gastrointestinal:  Positive for abdominal pain, constipation, diarrhea and nausea. Negative for blood in stool and vomiting.    {Labs  Heme  Chem  Endocrine  Serology  Results Review (optional):23779}   Objective    BP 115/81 (BP Location: Right Arm, Patient Position: Sitting, Cuff Size: Normal)   Wt 187 lb (84.8 kg)   SpO2 96%   BMI 26.83 kg/m  {Show previous vital signs (optional):23777}  Physical Exam  ***  No results found for any visits on 08/22/22.  Assessment & Plan     ***  No follow-ups on file.      {provider attestation***:1}   Mardene Speak, Hershal Coria  [facility] [phone] (phone) [phone] (fax)  [place]

## 2022-08-23 ENCOUNTER — Encounter: Payer: Self-pay | Admitting: Physician Assistant

## 2022-08-23 DIAGNOSIS — R0683 Snoring: Secondary | ICD-10-CM | POA: Insufficient documentation

## 2022-08-23 DIAGNOSIS — E663 Overweight: Secondary | ICD-10-CM | POA: Insufficient documentation

## 2022-08-23 DIAGNOSIS — F17200 Nicotine dependence, unspecified, uncomplicated: Secondary | ICD-10-CM | POA: Insufficient documentation

## 2022-08-28 ENCOUNTER — Telehealth: Payer: Self-pay | Admitting: Physician Assistant

## 2022-10-07 ENCOUNTER — Telehealth: Payer: Self-pay

## 2022-10-07 NOTE — Telephone Encounter (Signed)
Copied from CRM [id number]. Topic: General - Other >> Oct 07, 2022 11:44 AM Lyman Speller wrote: Reason for CRM: Pt hasn't heard about his sleep study results / please advise

## 2022-10-09 NOTE — Telephone Encounter (Signed)
Spoke with [facility], they are re-faxing the results.

## 2022-10-10 ENCOUNTER — Encounter: Payer: Self-pay | Admitting: Physician Assistant

## 2022-10-10 ENCOUNTER — Ambulatory Visit (INDEPENDENT_AMBULATORY_CARE_PROVIDER_SITE_OTHER): Payer: BC Managed Care – PPO | Admitting: Physician Assistant

## 2022-10-10 VITALS — BP 137/85 | HR 63 | Ht 70.0 in

## 2022-10-10 DIAGNOSIS — Z0000 Encounter for general adult medical examination without abnormal findings: Secondary | ICD-10-CM

## 2022-10-10 DIAGNOSIS — Z23 Encounter for immunization: Secondary | ICD-10-CM

## 2022-10-10 NOTE — Progress Notes (Unsigned)
I,Sha'taria Tyson,acting as a Neurosurgeon for [employer], PA-C.,have documented all relevant documentation on the behalf of Debera Lat, PA-C,as directed by  [employer], PA-C while in the presence of [employer], PA-C.   Complete physical exam   Patient: Jorge Rush   DOB: 11-04-1971   51 y.o. Male  MRN: [id number] Visit Date: 10/10/2022  Today's healthcare provider: Debera Lat, PA-C   No chief complaint on file.  Subjective    ABDIKADIR FOHL is a 52 y.o. male who presents today for a complete physical exam.  He reports consuming a general diet. The patient does not participate in regular exercise at present. He generally feels well. He reports sleeping well. He does not have additional problems to discuss today.  HPI  TD today? Shingles today? Hep C and HIV Screen?   No past medical history on file. Past Surgical History:  Procedure Laterality Date   CHOLECYSTECTOMY     Social History   Socioeconomic History   Marital status: Married    Spouse name: Not on file   Number of children: Not on file   Years of education: Not on file   Highest education level: Not on file  Occupational History   Not on file  Tobacco Use   Smoking status: Every Day    Packs/day: 0.50    Types: Cigarettes   Smokeless tobacco: Never  Substance and Sexual Activity   Alcohol use: Yes    Alcohol/week: 26.0 standard drinks of alcohol    Types: 3 Cans of beer, 10 Shots of liquor, 13 Standard drinks or equivalent per week    Comment: OCCASIONALLY   Drug use: Yes    Types: Marijuana    Comment: OCCASIONALLY   Sexual activity: Not on file  Other Topics Concern   Not on file  Social History Narrative   Not on file   Social Determinants of Health   Financial Resource Strain: Not on file  Food Insecurity: Not on file  Transportation Needs: Not on file  Physical Activity: Not on file  Stress: Not on file  Social Connections: Not on file  Intimate Partner Violence: Not on  file   Family Status  Relation Name Status   Mother  (Not Specified)   MGM  (Not Specified)   MGF  (Not Specified)   Family History  Problem Relation Age of Onset   Hypertension Mother    Breast cancer Maternal Grandmother    Heart disease Maternal Grandfather    Allergies  Allergen Reactions   Sulfa Antibiotics Rash    Patient Care Team: Debera Lat, PA-C as PCP - General (Physician Assistant)   Medications: Outpatient Medications Prior to Visit  Medication Sig   omeprazole (PRILOSEC) 20 MG capsule Take 1 capsule (20 mg total) by mouth daily.   No facility-administered medications prior to visit.    Review of Systems  {Labs  Heme  Chem  Endocrine  Serology  Results Review (optional):23779}  Objective    There were no vitals taken for this visit. BP Readings from Last 3 Encounters:  08/22/22 115/81  08/08/22 106/67  07/04/22 127/77       Physical Exam  ***  Last depression screening scores    07/04/2022    2:14 PM  PHQ 2/9 Scores  PHQ - 2 Score 0  PHQ- 9 Score 0   Last fall risk screening    07/04/2022    2:14 PM  Fall Risk   Falls in the  past year? 0  Number falls in past yr: 0  Injury with Fall? 0  Follow up Falls evaluation completed   Last Audit-C alcohol use screening    07/04/2022    2:15 PM  Alcohol Use Disorder Test (AUDIT)  1. How often do you have a drink containing alcohol? 3  2. How many drinks containing alcohol do you have on a typical day when you are drinking? 1  3. How often do you have six or more drinks on one occasion? 0  AUDIT-C Score 4   A score of 3 or more in women, and 4 or more in men indicates increased risk for alcohol abuse, EXCEPT if all of the points are from question 1   No results found for any visits on 10/10/22.  Assessment & Plan    Routine Health Maintenance and Physical Exam  Exercise Activities and Dietary recommendations  Goals   None      There is no immunization history on file for  this patient.  Health Maintenance  Topic Date Due   COVID-19 Vaccine (1) Never done   HIV Screening  Never done   Hepatitis C Screening  Never done   DTaP/Tdap/Td (1 - Tdap) Never done   Zoster Vaccines- Shingrix (1 of 2) Never done   INFLUENZA VACCINE  02/08/2023 (Originally 06/10/2022)   COLONOSCOPY (Pts 45-73yrs Insurance coverage will need to be confirmed)  05/12/2032   HPV VACCINES  Aged Out    Discussed health benefits of physical activity, and encouraged him to engage in regular exercise appropriate for his age and condition.  ***  No follow-ups on file.     {provider attestation***:1}   Debera Lat, Cordelia Poche  [facility] [phone] (phone) [phone] (fax)  [facility]

## 2022-10-12 DIAGNOSIS — Z0000 Encounter for general adult medical examination without abnormal findings: Secondary | ICD-10-CM | POA: Insufficient documentation

## 2022-12-24 ENCOUNTER — Encounter: Payer: Self-pay | Admitting: Physician Assistant

## 2022-12-24 ENCOUNTER — Ambulatory Visit (INDEPENDENT_AMBULATORY_CARE_PROVIDER_SITE_OTHER): Payer: BC Managed Care – PPO | Admitting: Physician Assistant

## 2022-12-24 VITALS — BP 126/87 | HR 77 | Wt 191.3 lb

## 2022-12-24 DIAGNOSIS — F17200 Nicotine dependence, unspecified, uncomplicated: Secondary | ICD-10-CM | POA: Diagnosis not present

## 2022-12-24 DIAGNOSIS — R21 Rash and other nonspecific skin eruption: Secondary | ICD-10-CM | POA: Diagnosis not present

## 2022-12-24 DIAGNOSIS — E663 Overweight: Secondary | ICD-10-CM

## 2022-12-24 DIAGNOSIS — K219 Gastro-esophageal reflux disease without esophagitis: Secondary | ICD-10-CM

## 2022-12-24 NOTE — Progress Notes (Unsigned)
     I,Sha'taria Tyson,acting as a Education administrator for [employer], PA-C.,have documented all relevant documentation on the behalf of Mardene Speak, PA-C,as directed by  [employer], PA-C while in the presence of [employer], PA-C.   Established patient visit   Patient: Jorge Rush   DOB: 01-10-1971   52 y.o. Male  MRN: [id number] Visit Date: 12/24/2022  Today's healthcare provider: Mardene Speak, PA-C     Subjective    HPI  Patient is being seen today due to spots on his skin. Patient reports spots are appearing more in the last few months. Patient reports more on the left leg than his right leg, also located on back, side and arms.   Medications: Outpatient Medications Prior to Visit  Medication Sig   omeprazole (PRILOSEC) 20 MG capsule Take 1 capsule (20 mg total) by mouth daily.   No facility-administered medications prior to visit.    Review of Systems  All other systems reviewed and are negative. Except see HPI  {Labs  Heme  Chem  Endocrine  Serology  Results Review (optional):23779}   Objective    There were no vitals taken for this visit. {Show previous vital signs (optional):23777}  Physical Exam Vitals reviewed.  Constitutional:      General: He is not in acute distress.    Appearance: Normal appearance. He is not diaphoretic.  HENT:     Head: Normocephalic and atraumatic.  Eyes:     General: No scleral icterus.    Conjunctiva/sclera: Conjunctivae normal.  Cardiovascular:     Rate and Rhythm: Normal rate and regular rhythm.     Pulses: Normal pulses.     Heart sounds: Normal heart sounds. No murmur heard. Pulmonary:     Effort: Pulmonary effort is normal. No respiratory distress.     Breath sounds: Normal breath sounds. No wheezing or rhonchi.  Musculoskeletal:     Cervical back: Neck supple.     Right lower leg: No edema.     Left lower leg: No edema.  Lymphadenopathy:     Cervical: No cervical adenopathy.  Skin:    General: Skin is warm  and dry.     Findings: Rash (both ankles,left leg,  back, both forearms, see media) present.  Neurological:     Mental Status: He is alert and oriented to person, place, and time. Mental status is at baseline.  Psychiatric:        Behavior: Behavior normal.        Thought Content: Thought content normal.        Judgment: Judgment normal.     No results found for any visits on 12/24/22.  Assessment & Plan     1. Rash Chronic   2. Smoking ***  3. Overweight (BMI 25.0-29.9) ***   No follow-ups on file.    The patient was advised to call back or seek an in-person evaluation if the symptoms worsen or if the condition fails to improve as anticipated.  I discussed the assessment and treatment plan with the patient. The patient was provided an opportunity to ask questions and all were answered. The patient agreed with the plan and demonstrated an understanding of the instructions.  The entirety of the information documented in the History of Present Illness, Review of Systems and Physical Exam were personally obtained by me. Portions of this information were initially documented by the CMA  and reviewed by me for thoroughness and accuracy.

## 2023-02-18 DIAGNOSIS — L439 Lichen planus, unspecified: Secondary | ICD-10-CM | POA: Diagnosis not present

## 2023-03-09 ENCOUNTER — Telehealth: Payer: Self-pay | Admitting: Physician Assistant

## 2023-03-24 ENCOUNTER — Ambulatory Visit: Payer: BC Managed Care – PPO | Admitting: Physician Assistant

## 2023-03-30 ENCOUNTER — Encounter: Payer: Self-pay | Admitting: Physician Assistant

## 2023-03-30 ENCOUNTER — Ambulatory Visit (INDEPENDENT_AMBULATORY_CARE_PROVIDER_SITE_OTHER): Payer: BC Managed Care – PPO | Admitting: Physician Assistant

## 2023-03-30 VITALS — BP 136/83 | HR 71 | Temp 98.2°F | Ht 70.0 in | Wt 196.0 lb

## 2023-03-30 DIAGNOSIS — F17200 Nicotine dependence, unspecified, uncomplicated: Secondary | ICD-10-CM | POA: Diagnosis not present

## 2023-03-30 DIAGNOSIS — E663 Overweight: Secondary | ICD-10-CM

## 2023-03-30 DIAGNOSIS — K219 Gastro-esophageal reflux disease without esophagitis: Secondary | ICD-10-CM | POA: Diagnosis not present

## 2023-03-30 DIAGNOSIS — R21 Rash and other nonspecific skin eruption: Secondary | ICD-10-CM

## 2023-03-30 NOTE — Progress Notes (Signed)
Established patient visit   Patient: Jorge Rush   DOB: 05/22/1971   52 y.o. Male  MRN: [id number] Visit Date: 03/30/2023  Today's healthcare provider: Debera Lat, PA-C   Chief Complaint  Patient presents with   Medical Management of Chronic Issues   Subjective    HPI  Patient is a 52 year old male who presents for follow up of acid reflux.  He states that when his medicine ran out he tried to go without taking it but was unable.  Still smoking up to 8 cig per day Still drinking 2-3 times a day , up to 2-3 drinks including heavy liquor per week  Planning to start going to gym with his colleague/[employer]  Medications: Outpatient Medications Prior to Visit  Medication Sig   omeprazole (PRILOSEC) 20 MG capsule Take 1 capsule (20 mg total) by mouth daily.   No facility-administered medications prior to visit.    Review of Systems  All other systems reviewed and are negative. Except see HPI      Objective    BP 136/83 (BP Location: Right Arm, Patient Position: Sitting, Cuff Size: Normal)   Pulse 71   Temp 98.2 F (36.8 C) (Oral)   Ht 5\' 10"  (1.778 m)   Wt 196 lb (88.9 kg)   SpO2 97%   BMI 28.12 kg/m    Physical Exam Vitals reviewed.  Constitutional:      General: He is not in acute distress.    Appearance: Normal appearance. He is not diaphoretic.  HENT:     Head: Normocephalic and atraumatic.  Eyes:     General: No scleral icterus.    Conjunctiva/sclera: Conjunctivae normal.  Cardiovascular:     Rate and Rhythm: Normal rate and regular rhythm.     Pulses: Normal pulses.     Heart sounds: Normal heart sounds. No murmur heard. Pulmonary:     Effort: Pulmonary effort is normal. No respiratory distress.     Breath sounds: Normal breath sounds. No wheezing or rhonchi.  Musculoskeletal:     Cervical back: Neck supple.     Right lower leg: No edema.     Left lower leg: No edema.  Lymphadenopathy:     Cervical: No cervical adenopathy.   Skin:    General: Skin is warm and dry.     Findings: No rash.  Neurological:     Mental Status: He is alert and oriented to person, place, and time. Mental status is at baseline.  Psychiatric:        Mood and Affect: Mood normal.        Behavior: Behavior normal.      No results found for any visits on 03/30/23.  Assessment & Plan     Problem List Items Addressed This Visit       Digestive   Esophageal reflux - Primary    Chronic and previously unstable Was out of his PPI for a few days and noticed GERD symptoms: belching, burping and so on Started on omeprazole again. Doing well. Advised to continue with his current PPI Will do imaging if symptoms worsen Elevate the head of the bed 6-8 inches, avoid recumbency for 3 hours after eating, avoid trigger food, weight loss  Advised to stop drinking alcohol         Other   Overweight (BMI 25.0-29.9)   Smoking    Chronic and stable Still smoking up to 8-10 cig a day Advised smoking cessation  Return in about 3 months (around 06/30/2023), or dec 1 , 2025, for CPE.      The patient was advised to call back or seek an in-person evaluation if the symptoms worsen or if the condition fails to improve as anticipated.  I discussed the assessment and treatment plan with the patient. The patient was provided an opportunity to ask questions and all were answered. The patient agreed with the plan and demonstrated an understanding of the instructions.  I, Debera Lat, PA-C have reviewed all documentation for this visit. The documentation on 03/30/23 for the exam, diagnosis, procedures, and orders are all accurate and complete.  Debera Lat, [facility], MMS [facility] [phone] (phone) [phone] (fax)   [facility]

## 2023-03-30 NOTE — Assessment & Plan Note (Signed)
Chronic and stable Still smoking up to 8-10 cig a day Advised smoking cessation

## 2023-03-30 NOTE — Assessment & Plan Note (Addendum)
Chronic and previously unstable Was out of his PPI for a few days and noticed GERD symptoms: belching, burping and so on Started on omeprazole again. Doing well. Advised to continue with his current PPI Will do imaging if symptoms worsen Elevate the head of the bed 6-8 inches, avoid recumbency for 3 hours after eating, avoid trigger food, weight loss  Advised to stop drinking alcohol

## 2023-04-20 DIAGNOSIS — L439 Lichen planus, unspecified: Secondary | ICD-10-CM | POA: Diagnosis not present

## 2023-06-28 ENCOUNTER — Other Ambulatory Visit: Payer: Self-pay | Admitting: Physician Assistant

## 2023-06-28 DIAGNOSIS — K219 Gastro-esophageal reflux disease without esophagitis: Secondary | ICD-10-CM

## 2023-08-17 NOTE — Telephone Encounter (Signed)
err

## 2023-10-13 ENCOUNTER — Encounter: Payer: Self-pay | Admitting: Physician Assistant

## 2023-10-13 ENCOUNTER — Ambulatory Visit (INDEPENDENT_AMBULATORY_CARE_PROVIDER_SITE_OTHER): Payer: BC Managed Care – PPO | Admitting: Physician Assistant

## 2023-10-13 VITALS — BP 131/83 | HR 67 | Ht 70.5 in | Wt 193.7 lb

## 2023-10-13 DIAGNOSIS — K219 Gastro-esophageal reflux disease without esophagitis: Secondary | ICD-10-CM

## 2023-10-13 DIAGNOSIS — Z114 Encounter for screening for human immunodeficiency virus [HIV]: Secondary | ICD-10-CM | POA: Diagnosis not present

## 2023-10-13 DIAGNOSIS — Z1159 Encounter for screening for other viral diseases: Secondary | ICD-10-CM | POA: Diagnosis not present

## 2023-10-13 DIAGNOSIS — R5383 Other fatigue: Secondary | ICD-10-CM

## 2023-10-13 DIAGNOSIS — R0683 Snoring: Secondary | ICD-10-CM

## 2023-10-13 DIAGNOSIS — R030 Elevated blood-pressure reading, without diagnosis of hypertension: Secondary | ICD-10-CM

## 2023-10-13 DIAGNOSIS — E663 Overweight: Secondary | ICD-10-CM

## 2023-10-13 DIAGNOSIS — Z0001 Encounter for general adult medical examination with abnormal findings: Secondary | ICD-10-CM | POA: Diagnosis not present

## 2023-10-13 DIAGNOSIS — Z125 Encounter for screening for malignant neoplasm of prostate: Secondary | ICD-10-CM

## 2023-10-13 DIAGNOSIS — F17200 Nicotine dependence, unspecified, uncomplicated: Secondary | ICD-10-CM | POA: Diagnosis not present

## 2023-10-13 DIAGNOSIS — Z0000 Encounter for general adult medical examination without abnormal findings: Secondary | ICD-10-CM

## 2023-10-13 NOTE — Progress Notes (Unsigned)
Complete physical exam  Patient: Jorge Rush   DOB: 01-Sep-1971   52 y.o. Male  MRN: [id number] Visit Date: 10/13/2023  Today's healthcare provider: Debera Lat, PA-C   No chief complaint on file.  Subjective    Jorge Rush is a 52 y.o. male who presents today for a complete physical exam.  He reports consuming a {diet types:17450} diet. {Exercise:19826} He generally feels {well/fairly well/poorly:18703}. He reports sleeping {well/fairly well/poorly:18703}. He {does/does not:200015} have additional problems to discuss today.  HPI  *** Discussed the use of AI scribe software for clinical note transcription with the patient, who gave verbal consent to proceed.  History of Present Illness            Last depression screening scores    03/30/2023    2:29 PM 12/24/2022    4:06 PM 10/10/2022    3:24 PM  PHQ 2/9 Scores  PHQ - 2 Score 0 0 0  PHQ- 9 Score 0 0 0   Last fall risk screening    03/30/2023    2:28 PM  Fall Risk   Falls in the past year? 0  Number falls in past yr: 0  Injury with Fall? 0   Last Audit-C alcohol use screening    03/30/2023    2:28 PM  Alcohol Use Disorder Test (AUDIT)  1. How often do you have a drink containing alcohol? 3  2. How many drinks containing alcohol do you have on a typical day when you are drinking? 0  3. How often do you have six or more drinks on one occasion? 0  AUDIT-C Score 3   A score of 3 or more in women, and 4 or more in men indicates increased risk for alcohol abuse, EXCEPT if all of the points are from question 1   No past medical history on file. Past Surgical History:  Procedure Laterality Date   CHOLECYSTECTOMY     Social History   Socioeconomic History   Marital status: Married    Spouse name: Not on file   Number of children: Not on file   Years of education: Not on file   Highest education level: Not on file  Occupational History   Not on file  Tobacco Use   Smoking status: Every Day     Current packs/day: 0.50    Types: Cigarettes   Smokeless tobacco: Never  Substance and Sexual Activity   Alcohol use: Yes    Alcohol/week: 26.0 standard drinks of alcohol    Types: 3 Cans of beer, 10 Shots of liquor, 13 Standard drinks or equivalent per week    Comment: OCCASIONALLY   Drug use: Yes    Types: Marijuana    Comment: OCCASIONALLY   Sexual activity: Not on file  Other Topics Concern   Not on file  Social History Narrative   Not on file   Social Determinants of Health   Financial Resource Strain: Not on file  Food Insecurity: Not on file  Transportation Needs: Not on file  Physical Activity: Not on file  Stress: Not on file  Social Connections: Not on file  Intimate Partner Violence: Not on file   Family Status  Relation Name Status   Mother  (Not Specified)   MGM  (Not Specified)   MGF  (Not Specified)  No partnership data on file   Family History  Problem Relation Age of Onset   Hypertension Mother    Breast cancer Maternal Grandmother  Heart disease Maternal Grandfather    Allergies  Allergen Reactions   Sulfa Antibiotics Rash    Patient Care Team: Cherlynn Polo as PCP - General (Physician Assistant)   Medications: Outpatient Medications Prior to Visit  Medication Sig   omeprazole (PRILOSEC) 20 MG capsule TAKE 1 CAPSULE BY MOUTH EVERY DAY   No facility-administered medications prior to visit.    Review of Systems  All other systems reviewed and are negative.  Except see HPI  {Insert previous labs (optional):23779} {See past labs  Heme  Chem  Endocrine  Serology  Results Review (optional):1}  Objective    There were no vitals taken for this visit. {Insert last BP/Wt (optional):23777}{See vitals history (optional):1}    Physical Exam Vitals reviewed.  Constitutional:      General: He is not in acute distress.    Appearance: Normal appearance. He is well-developed. He is not ill-appearing, toxic-appearing or  diaphoretic.  HENT:     Head: Normocephalic and atraumatic.     Right Ear: Tympanic membrane, ear canal and external ear normal.     Left Ear: Tympanic membrane, ear canal and external ear normal.     Nose: Nose normal. No congestion or rhinorrhea.     Mouth/Throat:     Mouth: Mucous membranes are moist.     Pharynx: Oropharynx is clear. No oropharyngeal exudate.  Eyes:     General: No scleral icterus.       Right eye: No discharge.        Left eye: No discharge.     Conjunctiva/sclera: Conjunctivae normal.     Pupils: Pupils are equal, round, and reactive to light.  Neck:     Thyroid: No thyromegaly.     Vascular: No carotid bruit.  Cardiovascular:     Rate and Rhythm: Normal rate and regular rhythm.     Pulses: Normal pulses.     Heart sounds: Normal heart sounds. No murmur heard.    No friction rub. No gallop.  Pulmonary:     Effort: Pulmonary effort is normal. No respiratory distress.     Breath sounds: Normal breath sounds. No wheezing or rales.  Abdominal:     General: Abdomen is flat. Bowel sounds are normal. There is no distension.     Palpations: Abdomen is soft. There is no mass.     Tenderness: There is no abdominal tenderness. There is no right CVA tenderness, left CVA tenderness, guarding or rebound.     Hernia: No hernia is present.  Musculoskeletal:        General: No swelling, tenderness, deformity or signs of injury. Normal range of motion.     Cervical back: Normal range of motion and neck supple. No rigidity or tenderness.     Right lower leg: No edema.     Left lower leg: No edema.  Lymphadenopathy:     Cervical: No cervical adenopathy.  Skin:    General: Skin is warm and dry.     Coloration: Skin is not jaundiced or pale.     Findings: No bruising, erythema, lesion or rash.  Neurological:     Mental Status: He is alert and oriented to person, place, and time. Mental status is at baseline.     Gait: Gait normal.  Psychiatric:        Mood and Affect:  Mood normal.        Behavior: Behavior normal.        Thought Content: Thought content normal.  Judgment: Judgment normal.      No results found for any visits on 10/13/23.  Assessment & Plan    Routine Health Maintenance and Physical Exam  Exercise Activities and Dietary recommendations  Goals   None     Immunization History  Administered Date(s) Administered   Pfizer(Comirnaty)Fall Seasonal Vaccine 12 years and older 01/21/2020, 02/11/2020   Td 10/10/2022   Tdap 02/23/2012    Health Maintenance  Topic Date Due   HIV Screening  Never done   Hepatitis C Screening  Never done   Zoster (Shingles) Vaccine (1 of 2) Never done   Flu Shot  Never done   COVID-19 Vaccine (2 - 2023-24 season) 07/12/2023   Colon Cancer Screening  05/12/2032   HPV Vaccine  Aged Out   DTaP/Tdap/Td vaccine  Discontinued    Discussed health benefits of physical activity, and encouraged him to engage in regular exercise appropriate for his age and condition.  UTD on dental/eye Things to do to keep yourself healthy  - Exercise at least 30-45 minutes a day, 3-4 days a week.  - Eat a low-fat diet with lots of fruits and vegetables, up to 7-9 servings per day.  - Seatbelts can save your life. Wear them always.  - Smoke detectors on every level of your home, check batteries every year.  - Eye Doctor - have an eye exam every 1-2 years  - Safe sex - if you may be exposed to STDs, use a condom.  - Alcohol -  If you drink, do it moderately, less than 2 drinks per day.  - Health Care Power of Attorney. Choose someone to speak for you if you are not able.  - Depression is common in our stressful world.If you're feeling down or losing interest in things you normally enjoy, please come in for a visit.  - Violence - If anyone is threatening or hurting you, please call immediately.               ***  No follow-ups on file.    The patient was advised to call back or seek an in-person evaluation  if the symptoms worsen or if the condition fails to improve as anticipated.  I discussed the assessment and treatment plan with the patient. The patient was provided an opportunity to ask questions and all were answered. The patient agreed with the plan and demonstrated an understanding of the instructions.  I, Debera Lat, PA-C have reviewed all documentation for this visit. The documentation on  10/13/23 for the exam, diagnosis, procedures, and orders are all accurate and complete.  Debera Lat, [facility], MMS [facility] [phone] (phone) [phone] (fax)  [facility]

## 2023-10-14 LAB — COMPREHENSIVE METABOLIC PANEL
ALT: 15 [IU]/L (ref 0–44)
AST: 14 [IU]/L (ref 0–40)
Albumin: 4.5 g/dL (ref 3.8–4.9)
Alkaline Phosphatase: 78 [IU]/L (ref 44–121)
BUN/Creatinine Ratio: 21 — ABNORMAL HIGH (ref 9–20)
BUN: 16 mg/dL (ref 6–24)
Bilirubin Total: 0.4 mg/dL (ref 0.0–1.2)
CO2: 23 mmol/L (ref 20–29)
Calcium: 9.3 mg/dL (ref 8.7–10.2)
Chloride: 104 mmol/L (ref 96–106)
Creatinine, Ser: 0.77 mg/dL (ref 0.76–1.27)
Globulin, Total: 2.7 g/dL (ref 1.5–4.5)
Glucose: 98 mg/dL (ref 70–99)
Potassium: 4.1 mmol/L (ref 3.5–5.2)
Sodium: 140 mmol/L (ref 134–144)
Total Protein: 7.2 g/dL (ref 6.0–8.5)
eGFR: 107 mL/min/{1.73_m2} (ref >59)

## 2023-10-14 LAB — PSA TOTAL (REFLEX TO FREE): Prostate Specific Ag, Serum: 0.6 ng/mL (ref 0.0–4.0)

## 2023-10-14 LAB — LIPID PANEL
Chol/HDL Ratio: 5.4 {ratio} — ABNORMAL HIGH (ref 0.0–5.0)
Cholesterol, Total: 193 mg/dL (ref 100–199)
HDL: 36 mg/dL — ABNORMAL LOW (ref >39)
LDL Chol Calc (NIH): 135 mg/dL — ABNORMAL HIGH (ref 0–99)
Triglycerides: 122 mg/dL (ref 0–149)
VLDL Cholesterol Cal: 22 mg/dL (ref 5–40)

## 2023-10-14 LAB — HIV ANTIBODY (ROUTINE TESTING W REFLEX): HIV Screen 4th Generation wRfx: NONREACTIVE

## 2023-10-14 LAB — HEPATITIS C ANTIBODY: Hep C Virus Ab: NONREACTIVE

## 2023-10-15 ENCOUNTER — Other Ambulatory Visit: Payer: Self-pay

## 2023-10-15 DIAGNOSIS — E7800 Pure hypercholesterolemia, unspecified: Secondary | ICD-10-CM

## 2023-10-15 MED ORDER — ROSUVASTATIN CALCIUM 5 MG PO TABS: 5.0000 mg | ORAL_TABLET | Freq: Every day | ORAL | 0 refills | Status: AC

## 2023-10-15 NOTE — Progress Notes (Signed)
If patient agrees please prescribe rosuvastatin 5 Mg by mouth daily, at bedtime, #30 this no refills

## 2023-10-20 ENCOUNTER — Telehealth: Payer: Self-pay

## 2023-10-20 NOTE — Telephone Encounter (Signed)
Copied from CRM [id number]. Topic: Appointment Scheduling - Scheduling Inquiry for Clinic >> Oct 20, 2023 11:28 AM Benetta Spar B wrote: Reason for CRM: pt called in wanting to know should he make an appt to have his cholesterol checked after taking the med, for 30 days from today. Please cb

## 2023-10-21 ENCOUNTER — Other Ambulatory Visit: Payer: Self-pay | Admitting: Physician Assistant

## 2023-10-21 ENCOUNTER — Encounter: Payer: Self-pay | Admitting: Physician Assistant

## 2023-10-21 DIAGNOSIS — F17200 Nicotine dependence, unspecified, uncomplicated: Secondary | ICD-10-CM

## 2023-10-21 DIAGNOSIS — E785 Hyperlipidemia, unspecified: Secondary | ICD-10-CM | POA: Insufficient documentation

## 2023-10-21 MED ORDER — ROSUVASTATIN CALCIUM 5 MG PO TABS: 5.0000 mg | ORAL_TABLET | Freq: Every day | ORAL | 0 refills | Status: DC

## 2023-11-10 ENCOUNTER — Other Ambulatory Visit: Payer: Self-pay | Admitting: Physician Assistant

## 2023-11-10 DIAGNOSIS — K219 Gastro-esophageal reflux disease without esophagitis: Secondary | ICD-10-CM

## 2023-11-14 NOTE — Telephone Encounter (Signed)
 Requested Prescriptions  Pending Prescriptions Disp Refills   omeprazole  (PRILOSEC) 20 MG capsule [Pharmacy Med Name: OMEPRAZOLE  DR 20 MG CAPSULE] 90 capsule 3    Sig: TAKE 1 CAPSULE BY MOUTH EVERY DAY     Gastroenterology: Proton Pump Inhibitors Passed - 11/14/2023  8:32 AM      Passed - Valid encounter within last 12 months    Recent Outpatient Visits           1 month ago Annual physical exam   [place] [facility] [place], [place], PA-C   7 months ago Gastroesophageal reflux disease without esophagitis   [place] [facility] [place], [place], PA-C   10 months ago Rash   [facility] [place], [place], PA-C   1 year ago Annual physical exam   [place] [facility] [place], [place], PA-C   1 year ago Snoring   [facility] [place], [place], PA-C       Future Appointments             In 11 months Ostwalt, Janna, PA-C [facility], [facility]

## 2024-02-12 ENCOUNTER — Other Ambulatory Visit: Payer: Self-pay | Admitting: Physician Assistant

## 2024-02-12 DIAGNOSIS — E785 Hyperlipidemia, unspecified: Secondary | ICD-10-CM

## 2024-02-12 NOTE — Telephone Encounter (Signed)
 Last OV 10/13/23 Requested Prescriptions  Pending Prescriptions Disp Refills   rosuvastatin (CRESTOR) 5 MG tablet [Pharmacy Med Name: ROSUVASTATIN CALCIUM 5 MG TAB] 90 tablet 0    Sig: TAKE 1 TABLET (5 MG TOTAL) BY MOUTH DAILY.     Cardiovascular:  Antilipid - Statins 2 Failed - 02/12/2024  3:16 PM      Failed - Valid encounter within last 12 months    Recent Outpatient Visits   None     Future Appointments             In 8 months Ostwalt, Janna, PA-C [facility], [facility]            Failed - Lipid Panel in normal range within the last 12 months    Cholesterol, Total  Date Value Ref Range Status  10/13/2023 193 100 - 199 mg/dL Final   LDL Chol Calc (NIH)  Date Value Ref Range Status  10/13/2023 135 (H) 0 - 99 mg/dL Final   HDL  Date Value Ref Range Status  10/13/2023 36 (L) >39 mg/dL Final   Triglycerides  Date Value Ref Range Status  10/13/2023 122 0 - 149 mg/dL Final         Passed - Cr in normal range and within 360 days    Creatinine, Ser  Date Value Ref Range Status  10/13/2023 0.77 0.76 - 1.27 mg/dL Final         Passed - Patient is not pregnant

## 2024-03-28 ENCOUNTER — Ambulatory Visit: Payer: Self-pay

## 2024-03-28 NOTE — Telephone Encounter (Signed)
 Chief Complaint: shoulder swelling Symptoms: swelling  Frequency: 3-4 weeks Pertinent Negatives: Patient denies pain Disposition: [] ED /[] Urgent Care (no appt availability in office) / [x] Appointment(In office/virtual)/ []  [place] [facility]/ [] Home Care/ [] Refused Recommended Disposition /[] [place] [facility]/ []  Follow-up with PCP Additional Notes: pt states that he is experiencing some swelling at the base where is shoulder and neck meets on the left side.  States he noticed it about a month ago. States something bite him while mowing but that went away. Denies pain.   Copied from CRM [id number]. Topic: Clinical - Red Word Triage >> Mar 28, 2024  1:47 PM Ivette P wrote: Red Word that prompted transfer to Nurse Triage: last 2 week left side of neck, where shoulder blade. puffy or swollen. no pain. noticed it. Reason for Disposition . Nursing judgment  Protocols used: No Guideline or Reference Available-A-AH

## 2024-03-30 ENCOUNTER — Encounter: Payer: Self-pay | Admitting: Physician Assistant

## 2024-03-30 ENCOUNTER — Ambulatory Visit: Admitting: Physician Assistant

## 2024-03-30 VITALS — BP 127/78 | HR 60 | Resp 16 | Ht 71.0 in | Wt 189.0 lb

## 2024-03-30 DIAGNOSIS — W57XXXA Bitten or stung by nonvenomous insect and other nonvenomous arthropods, initial encounter: Secondary | ICD-10-CM | POA: Diagnosis not present

## 2024-03-30 DIAGNOSIS — E785 Hyperlipidemia, unspecified: Secondary | ICD-10-CM | POA: Diagnosis not present

## 2024-03-30 DIAGNOSIS — E7800 Pure hypercholesterolemia, unspecified: Secondary | ICD-10-CM

## 2024-03-30 DIAGNOSIS — E663 Overweight: Secondary | ICD-10-CM

## 2024-03-30 DIAGNOSIS — K219 Gastro-esophageal reflux disease without esophagitis: Secondary | ICD-10-CM | POA: Diagnosis not present

## 2024-03-30 NOTE — Progress Notes (Unsigned)
 Established patient visit  Patient: Jorge Rush   DOB: [birth year]   53 y.o. Male  MRN: [id number] Visit Date: 03/30/2024  Today's healthcare provider: Blane Bunting, PA-C   Chief Complaint  Patient presents with   Shoulder Pain    Lt shoulder pain 3 weeks to  month, pt want testing for blockage in heart.   Subjective     HPI     Shoulder Pain    Additional comments: Lt shoulder pain 3 weeks to  month, pt want testing for blockage in heart.      Last edited by Estill Hemming, CMA on 03/30/2024  3:57 PM.       Discussed the use of AI scribe software for clinical note transcription with the patient, who gave verbal consent to proceed.  History of Present Illness Jorge Rush is a 53 year old male who presents with itching and redness on his neck and shoulder area.  Itching and redness on the neck and shoulder area occur particularly when he is hot and sweaty. The sensation is described as intense itching, and the skin feels slick compared to surrounding areas. Symptoms began a few weeks ago following an insect bite on the neck, which resulted in a bump. He applies alcohol to the area without significant improvement. There is no associated pain.  No viral infection, nerve pain, tingling, or numbness. He experiences glare when looking at his phone but has no headaches or vision problems. He wears glasses for vision correction.  He takes 5 mg of medication for high cholesterol and 20 mg for acid reflux. He is concerned about potential arterial blockages due to cholesterol levels. He engages in physical activities like working on cars and mowing but has avoided the gym recently due to illness concerns at work.       03/30/2023    2:29 PM 12/24/2022    4:06 PM 10/10/2022    3:24 PM  Depression screen PHQ 2/9  Decreased Interest 0 0 0  Down, Depressed, Hopeless 0 0 0  PHQ - 2 Score 0 0 0  Altered sleeping 0 0 0  Tired, decreased energy 0 0 0  Change in appetite 0 0 0   Feeling bad or failure about yourself  0 0 0  Trouble concentrating 0 0 0  Moving slowly or fidgety/restless 0 0 0  Suicidal thoughts 0 0 0  PHQ-9 Score 0 0 0  Difficult doing work/chores Not difficult at all Not difficult at all Not difficult at all       No data to display          Medications: Outpatient Medications Prior to Visit  Medication Sig   omeprazole  (PRILOSEC) 20 MG capsule TAKE 1 CAPSULE BY MOUTH EVERY DAY   rosuvastatin  (CRESTOR ) 5 MG tablet Take 1 tablet (5 mg total) by mouth daily.   rosuvastatin  (CRESTOR ) 5 MG tablet TAKE 1 TABLET (5 MG TOTAL) BY MOUTH DAILY.   No facility-administered medications prior to visit.    Review of Systems All negative Except see HPI   {Insert previous labs (optional):23779} {See past labs  Heme  Chem  Endocrine  Serology  Results Review (optional):1}   Objective    BP 127/78 (BP Location: Right Arm, Patient Position: Sitting, Cuff Size: Large)   Pulse 60   Resp 16   Ht 5\' 11"  (1.803 m)   Wt 189 lb (85.7 kg)   SpO2 97%   BMI 26.36 kg/m  {Insert last BP/Wt (  optional):23777}{See vitals history (optional):1}   Physical Exam Vitals reviewed.  Constitutional:      General: He is not in acute distress.    Appearance: Normal appearance. He is not diaphoretic.  HENT:     Head: Normocephalic and atraumatic.  Eyes:     General: No scleral icterus.    Conjunctiva/sclera: Conjunctivae normal.  Cardiovascular:     Rate and Rhythm: Normal rate and regular rhythm.     Pulses: Normal pulses.     Heart sounds: Normal heart sounds. No murmur heard. Pulmonary:     Effort: Pulmonary effort is normal. No respiratory distress.     Breath sounds: Normal breath sounds. No wheezing or rhonchi.  Musculoskeletal:     Cervical back: Neck supple.     Right lower leg: No edema.     Left lower leg: No edema.  Lymphadenopathy:     Cervical: No cervical adenopathy.  Skin:    General: Skin is warm and dry.     Findings: No rash.   Neurological:     Mental Status: He is alert and oriented to person, place, and time. Mental status is at baseline.  Psychiatric:        Mood and Affect: Mood normal.        Behavior: Behavior normal.      No results found for any visits on 03/30/24.      Assessment and Plan Assessment & Plan Insect bite with localized itching Localized itching on shoulder, likely insect bite or environmental irritation. No infection or significant swelling. - Recommend insect repellent on clothing. - Provide Zyrtec sample for itching relief. - Advise monitoring for swelling, redness, or other symptoms. - Instruct to photograph area for monitoring.  Gastroesophageal reflux disease (GERD) GERD well-managed with current medication. - Continue current GERD medication regimen.  Hyperlipidemia Hyperlipidemia with need to recheck cholesterol. Interest in further cardiovascular testing due to family history. - Order fasting lipid panel. - Discuss potential further cardiovascular testing.    Orders Placed This Encounter  Procedures   Lipid panel    Has the patient fasted?:   Yes   Comprehensive metabolic panel with GFR    Has the patient fasted?:   Yes    No follow-ups on file.   The patient was advised to call back or seek an in-person evaluation if the symptoms worsen or if the condition fails to improve as anticipated.  I discussed the assessment and treatment plan with the patient. The patient was provided an opportunity to ask questions and all were answered. The patient agreed with the plan and demonstrated an understanding of the instructions.  I, Darrick Greenlaw, PA-C have reviewed all documentation for this visit. The documentation on 03/30/2024  for the exam, diagnosis, procedures, and orders are all accurate and complete.  Blane Bunting, [facility], MMS [facility] [phone] (phone) [phone] (fax)  [facility]

## 2024-04-01 ENCOUNTER — Telehealth: Payer: Self-pay | Admitting: Physician Assistant

## 2024-04-01 DIAGNOSIS — R0989 Other specified symptoms and signs involving the circulatory and respiratory systems: Secondary | ICD-10-CM

## 2024-04-01 DIAGNOSIS — E785 Hyperlipidemia, unspecified: Secondary | ICD-10-CM | POA: Diagnosis not present

## 2024-04-01 NOTE — Telephone Encounter (Signed)
 Copied from CRM [id number]. Topic: General - Other >> Apr 01, 2024 11:00 AM Elgin Grit wrote: Reason for CRM: patient did a walk in requesting that he get a CT scan on his cornarny artery

## 2024-04-01 NOTE — Telephone Encounter (Signed)
 CT  scan for coronary artery is being requesting for this patient

## 2024-04-02 LAB — COMPREHENSIVE METABOLIC PANEL WITH GFR
ALT: 19 IU/L (ref 0–44)
AST: 21 IU/L (ref 0–40)
Albumin: 4.4 g/dL (ref 3.8–4.9)
Alkaline Phosphatase: 73 IU/L (ref 44–121)
BUN/Creatinine Ratio: 18 (ref 9–20)
BUN: 14 mg/dL (ref 6–24)
Bilirubin Total: 0.4 mg/dL (ref 0.0–1.2)
CO2: 20 mmol/L (ref 20–29)
Calcium: 9 mg/dL (ref 8.7–10.2)
Chloride: 109 mmol/L — ABNORMAL HIGH (ref 96–106)
Creatinine, Ser: 0.79 mg/dL (ref 0.76–1.27)
Globulin, Total: 2.5 g/dL (ref 1.5–4.5)
Glucose: 96 mg/dL (ref 70–99)
Potassium: 3.9 mmol/L (ref 3.5–5.2)
Sodium: 144 mmol/L (ref 134–144)
Total Protein: 6.9 g/dL (ref 6.0–8.5)
eGFR: 106 mL/min/{1.73_m2} (ref >59)

## 2024-04-02 LAB — LIPID PANEL
Chol/HDL Ratio: 3.5 ratio (ref 0.0–5.0)
Cholesterol, Total: 114 mg/dL (ref 100–199)
HDL: 33 mg/dL — ABNORMAL LOW (ref >39)
LDL Chol Calc (NIH): 62 mg/dL (ref 0–99)
Triglycerides: 99 mg/dL (ref 0–149)
VLDL Cholesterol Cal: 19 mg/dL (ref 5–40)

## 2024-04-05 ENCOUNTER — Ambulatory Visit: Payer: Self-pay | Admitting: Physician Assistant

## 2024-04-05 DIAGNOSIS — G4730 Sleep apnea, unspecified: Secondary | ICD-10-CM

## 2024-04-05 DIAGNOSIS — R0989 Other specified symptoms and signs involving the circulatory and respiratory systems: Secondary | ICD-10-CM

## 2024-04-05 DIAGNOSIS — E785 Hyperlipidemia, unspecified: Secondary | ICD-10-CM

## 2024-04-29 NOTE — Telephone Encounter (Signed)
Please see the message below and advise.

## 2024-05-05 DIAGNOSIS — G4730 Sleep apnea, unspecified: Secondary | ICD-10-CM | POA: Insufficient documentation

## 2024-05-12 ENCOUNTER — Ambulatory Visit
Admission: RE | Admit: 2024-05-12 | Discharge: 2024-05-12 | Disposition: A | Discharge status: Home / Self Care | Source: Ambulatory Visit | Attending: Physician Assistant | Admitting: Physician Assistant

## 2024-05-12 DIAGNOSIS — G4730 Sleep apnea, unspecified: Secondary | ICD-10-CM

## 2024-05-12 DIAGNOSIS — R0989 Other specified symptoms and signs involving the circulatory and respiratory systems: Secondary | ICD-10-CM

## 2024-05-12 DIAGNOSIS — E785 Hyperlipidemia, unspecified: Secondary | ICD-10-CM

## 2024-05-16 ENCOUNTER — Ambulatory Visit: Payer: Self-pay | Admitting: Physician Assistant

## 2024-05-21 ENCOUNTER — Other Ambulatory Visit: Payer: Self-pay | Admitting: Physician Assistant

## 2024-05-21 DIAGNOSIS — E785 Hyperlipidemia, unspecified: Secondary | ICD-10-CM

## 2024-05-23 NOTE — Telephone Encounter (Signed)
 Requested Prescriptions  Pending Prescriptions Disp Refills   rosuvastatin  (CRESTOR ) 5 MG tablet [Pharmacy Med Name: ROSUVASTATIN  CALCIUM  5 MG TAB] 90 tablet 3    Sig: TAKE 1 TABLET (5 MG TOTAL) BY MOUTH DAILY.     Cardiovascular:  Antilipid - Statins 2 Failed - 05/23/2024  2:49 PM      Failed - Valid encounter within last 12 months    Recent Outpatient Visits           1 month ago Dyslipidemia (high LDL; low HDL)   [place] [facility] [place], [place], PA-C       Future Appointments             In 4 months Ostwalt, Janna, PA-C [place] [facility]            Failed - Lipid Panel in normal range within the last 12 months    Cholesterol, Total  Date Value Ref Range Status  04/01/2024 114 100 - 199 mg/dL Final   LDL Chol Calc (NIH)  Date Value Ref Range Status  04/01/2024 62 0 - 99 mg/dL Final   HDL  Date Value Ref Range Status  04/01/2024 33 (L) >39 mg/dL Final   Triglycerides  Date Value Ref Range Status  04/01/2024 99 0 - 149 mg/dL Final         Passed - Cr in normal range and within 360 days    Creatinine, Ser  Date Value Ref Range Status  04/01/2024 0.79 0.76 - 1.27 mg/dL Final         Passed - Patient is not pregnant

## 2024-10-07 NOTE — Progress Notes (Unsigned)
 Complete physical exam  Patient: Jorge Rush   DOB: September 05, 1971   53 y.o. Male  MRN: [id number] Visit Date: 10/14/2024  Today's healthcare provider: Fergie Sherbert, PA-C   Chief Complaint  Patient presents with   Annual Exam    Diet- General Exercise- Not lately, other than working 3-5 miles at work Overall feeling- Okay Sleep- Not trouble going to sleep but states his wife says he snores. Concerns- Not really  Vaccines- declined   Subjective    Jorge Rush is a 53 y.o. male who presents today for a complete physical exam.   HPI     Annual Exam    Additional comments: Diet- General Exercise- Not lately, other than working 3-5 miles at work Overall feeling- Okay Sleep- Not trouble going to sleep but states his wife says he snores. Concerns- Not really  Vaccines- declined      Last edited by Terrel Powell LITTIE, CMA on 10/14/2024  8:58 AM.     Discussed the use of AI scribe software for clinical note transcription with the patient, who gave verbal consent to proceed.  History of Present Illness Jorge Rush is a 53 year old male with hypertension and hyperlipidemia who presents for a follow-up visit.  He stopped taking Crestor  5 mg daily over a year ago after normal cholesterol results in June. He currently takes omeprazole  20 mg daily for acid reflux.  He notes occasional elevated blood pressure readings.  He snores with known sleep apnea. He has tiredness and discomfort in his feet after prolonged standing and walking on concrete at work and uses expensive insoles, which help.  He has eye redness on waking that he attributes to dryness or allergies and uses eye drops regularly. He has nasal congestion he associates with seasonal allergies. His mother has severe sinus allergies.  He is trying to improve his diet by eating more turkey and fish and fewer burgers and is interested in increasing healthier Asian cuisine options.     Last depression  screening scores    10/14/2024    9:01 AM 03/30/2023    2:29 PM 12/24/2022    4:06 PM  PHQ 2/9 Scores  PHQ - 2 Score 0 0 0  PHQ- 9 Score 0 0  0      Data saved with a previous flowsheet row definition   Last fall risk screening    10/14/2024    9:01 AM  Fall Risk   Falls in the past year? 0  Number falls in past yr: 0  Injury with Fall? 0  Risk for fall due to : No Fall Risks   Last Audit-C alcohol use screening    03/30/2023    2:28 PM  Alcohol Use Disorder Test (AUDIT)  1. How often do you have a drink containing alcohol? 3  2. How many drinks containing alcohol do you have on a typical day when you are drinking? 0  3. How often do you have six or more drinks on one occasion? 0  AUDIT-C Score 3   A score of 3 or more in women, and 4 or more in men indicates increased risk for alcohol abuse, EXCEPT if all of the points are from question 1   Past Medical History:  Diagnosis Date   Allergy    GERD (gastroesophageal reflux disease)    Past Surgical History:  Procedure Laterality Date   CHOLECYSTECTOMY     Social History   Socioeconomic History  Marital status: Married    Spouse name: Not on file   Number of children: Not on file   Years of education: Not on file   Highest education level: Not on file  Occupational History   Not on file  Tobacco Use   Smoking status: Every Day    Current packs/day: 0.50    Types: Cigarettes   Smokeless tobacco: Never  Substance and Sexual Activity   Alcohol use: Yes    Alcohol/week: 26.0 standard drinks of alcohol    Types: 3 Cans of beer, 10 Shots of liquor, 13 Standard drinks or equivalent per week    Comment: OCCASIONALLY   Drug use: Yes    Types: Marijuana    Comment: OCCASIONALLY   Sexual activity: Not on file  Other Topics Concern   Not on file  Social History Narrative   Not on file   Social Drivers of Health   Financial Resource Strain: Not on file  Food Insecurity: Not on file  Transportation Needs: Not on  file  Physical Activity: Not on file  Stress: Not on file  Social Connections: Not on file  Intimate Partner Violence: Not on file   Family Status  Relation Name Status   Mother Bonnie Roig (Not Specified)   MGM  (Not Specified)   MGF  (Not Specified)  No partnership data on file   Family History  Problem Relation Age of Onset   Hypertension Mother    Heart disease Mother    Breast cancer Maternal Grandmother    Heart disease Maternal Grandfather    Allergies  Allergen Reactions   Sulfa Antibiotics Rash    Patient Care Team: Krisna Omar, PA-C as PCP - General (Physician Assistant)   Medications: Outpatient Medications Prior to Visit  Medication Sig   omeprazole  (PRILOSEC) 20 MG capsule TAKE 1 CAPSULE BY MOUTH EVERY DAY   rosuvastatin  (CRESTOR ) 5 MG tablet Take 1 tablet (5 mg total) by mouth daily.   rosuvastatin  (CRESTOR ) 5 MG tablet TAKE 1 TABLET (5 MG TOTAL) BY MOUTH DAILY. (Patient not taking: Reported on 10/14/2024)   No facility-administered medications prior to visit.    Review of Systems  All other systems reviewed and are negative.  Except see HPI  {Insert previous labs (optional):23779} {See past labs  Heme  Chem  Endocrine  Serology  Results Review (optional):1}  Objective    BP 132/78 (BP Location: Right Arm, Patient Position: Sitting, Cuff Size: Normal)   Pulse (!) 58   Temp 98.1 F (36.7 C) (Oral)   Ht 5' 10 (1.778 m)   Wt 195 lb 4.8 oz (88.6 kg)   SpO2 99%   BMI 28.02 kg/m  {Insert last BP/Wt (optional):23777}{See vitals history (optional):1}    Physical Exam Vitals reviewed.  Constitutional:      General: He is not in acute distress.    Appearance: Normal appearance. He is well-developed. He is not ill-appearing, toxic-appearing or diaphoretic.  HENT:     Head: Normocephalic and atraumatic.     Right Ear: Tympanic membrane, ear canal and external ear normal.     Left Ear: Tympanic membrane, ear canal and external ear normal.      Nose: Nose normal. No congestion or rhinorrhea.     Mouth/Throat:     Mouth: Mucous membranes are moist.     Pharynx: Oropharynx is clear. No oropharyngeal exudate.  Eyes:     General: No scleral icterus.       Right eye: No discharge.  Left eye: No discharge.     Conjunctiva/sclera: Conjunctivae normal.     Pupils: Pupils are equal, round, and reactive to light.  Neck:     Thyroid: No thyromegaly.     Vascular: No carotid bruit.  Cardiovascular:     Rate and Rhythm: Normal rate and regular rhythm.     Pulses: Normal pulses.     Heart sounds: Normal heart sounds. No murmur heard.    No friction rub. No gallop.  Pulmonary:     Effort: Pulmonary effort is normal. No respiratory distress.     Breath sounds: Normal breath sounds. No wheezing or rales.  Abdominal:     General: Abdomen is flat. Bowel sounds are normal. There is no distension.     Palpations: Abdomen is soft. There is no mass.     Tenderness: There is no abdominal tenderness. There is no right CVA tenderness, left CVA tenderness, guarding or rebound.     Hernia: No hernia is present.  Musculoskeletal:        General: No swelling, tenderness, deformity or signs of injury. Normal range of motion.     Cervical back: Normal range of motion and neck supple. No rigidity or tenderness.     Right lower leg: No edema.     Left lower leg: No edema.  Lymphadenopathy:     Cervical: No cervical adenopathy.  Skin:    General: Skin is warm and dry.     Coloration: Skin is not jaundiced or pale.     Findings: No bruising, erythema, lesion or rash.  Neurological:     Mental Status: He is alert and oriented to person, place, and time. Mental status is at baseline.     Gait: Gait normal.  Psychiatric:        Mood and Affect: Mood normal.        Behavior: Behavior normal.        Thought Content: Thought content normal.        Judgment: Judgment normal.      No results found for any visits on 10/14/24.  Assessment &  Plan    Routine Health Maintenance and Physical Exam  Exercise Activities and Dietary recommendations  Goals   None     Immunization History  Administered Date(s) Administered   Pfizer(Comirnaty)Fall Seasonal Vaccine 12 years and older 01/21/2020, 02/11/2020   Td 10/10/2022   Tdap 02/23/2012    Health Maintenance  Topic Date Due   Pneumococcal Vaccine for age over 35 (1 of 2 - PCV) Never done   Hepatitis B Vaccine (1 of 3 - 19+ 3-dose series) Never done   Zoster (Shingles) Vaccine (1 of 2) Never done   Flu Shot  Never done   COVID-19 Vaccine (3 - 2025-26 season) 07/11/2024   Colon Cancer Screening  05/26/2032   Hepatitis C Screening  Completed   HIV Screening  Completed   HPV Vaccine  Aged Out   Meningitis B Vaccine  Aged Out   DTaP/Tdap/Td vaccine  Discontinued    Discussed health benefits of physical activity, and encouraged him to engage in regular exercise appropriate for his age and condition. Assessment & Plan Adult Wellness Visit Routine wellness visit focused on lifestyle modifications. - Ordered blood work including lipid panel. - Encouraged regular exercise and healthy diet. - Scheduled follow-up in one year for physical. UTD on dental exam Things to do to keep yourself healthy  - Exercise at least 30-45 minutes a day, 3-4 days a week.  -  Eat a low-fat diet with lots of fruits and vegetables, up to 7-9 servings per day.  - Seatbelts can save your life. Wear them always.  - Smoke detectors on every level of your home, check batteries every year.  - Eye Doctor - have an eye exam every 1-2 years  - Safe sex - if you may be exposed to STDs, use a condom.  - Alcohol -  If you drink, do it moderately, less than 2 drinks per day.  - Health Care Power of Attorney. Choose someone to speak for you if you are not able.  - Depression is common in our stressful world.If you're feeling down or losing interest in things you normally enjoy, please come in for a visit.  -  Violence - If anyone is threatening or hurting you, please call immediately.  Gastro-esophageal reflux disease Chronic and stable Managed with omeprazole  20 mg. Continue with lifestyle modifications Will follow-up  Hyperlipidemia Chronic and previously stable But pt stopped taking Crestor  5 mg after normal cholesterol levels. Discussed medication adherence. - Ordered lipid panel. - Discussed potential need to restart Crestor  if cholesterol levels are elevated. Will follow-up  Hypertension Chronic and stable  Not on medications Elevated blood pressure possibly linked to obstructive sleep apnea. Emphasized monitoring and lifestyle changes. - Monitor blood pressure daily, especially in the morning and evening. - Schedule appointment if blood pressure remains elevated. - Encouraged lifestyle modifications including diet and exercise. Will follow-up  Obstructive sleep apnea Consideration of CPAP or mandibular advancement device. Discussed impact on blood pressure. Advised weight loss via diet and exercise Will follow-up  Tobacco use Chronic Continued use with associated health risks. Cessation encouraged  Screening for malignant neoplasm of prostate No symptoms suggestive of prostate enlargement or malignancy. Except urinary urgency -psa Will follow-up  Allergic rhinitis and dry eyes Symptoms of nasal congestion and dry eyes, possibly allergy-related. Discussed treatment options. - Use nasal saline spray and Flonase for nasal congestion. - Use allergy eye drops for dry eyes. Will follow-up  No follow-ups on file.    The patient was advised to call back or seek an in-person evaluation if the symptoms worsen or if the condition fails to improve as anticipated.  I discussed the assessment and treatment plan with the patient. The patient was provided an opportunity to ask questions and all were answered. The patient agreed with the plan and demonstrated an understanding of  the instructions.  I, Georgi Tuel, PA-C have reviewed all documentation for this visit. The documentation on 10/14/2024  for the exam, diagnosis, procedures, and orders are all accurate and complete.  Jolynn Spencer, [facility], MMS [facility] [phone] (phone) [phone] (fax)  [facility]

## 2024-10-14 ENCOUNTER — Ambulatory Visit: Payer: Self-pay | Admitting: Physician Assistant

## 2024-10-14 ENCOUNTER — Encounter: Payer: Self-pay | Admitting: Physician Assistant

## 2024-10-14 VITALS — BP 132/78 | HR 58 | Temp 98.1°F | Ht 70.0 in | Wt 195.3 lb

## 2024-10-14 DIAGNOSIS — E7849 Other hyperlipidemia: Secondary | ICD-10-CM

## 2024-10-14 DIAGNOSIS — E785 Hyperlipidemia, unspecified: Secondary | ICD-10-CM | POA: Diagnosis not present

## 2024-10-14 DIAGNOSIS — Z0000 Encounter for general adult medical examination without abnormal findings: Secondary | ICD-10-CM | POA: Diagnosis not present

## 2024-10-14 DIAGNOSIS — F17200 Nicotine dependence, unspecified, uncomplicated: Secondary | ICD-10-CM | POA: Diagnosis not present

## 2024-10-14 DIAGNOSIS — K219 Gastro-esophageal reflux disease without esophagitis: Secondary | ICD-10-CM | POA: Diagnosis not present

## 2024-10-14 DIAGNOSIS — J3089 Other allergic rhinitis: Secondary | ICD-10-CM | POA: Diagnosis not present

## 2024-10-14 DIAGNOSIS — R030 Elevated blood-pressure reading, without diagnosis of hypertension: Secondary | ICD-10-CM | POA: Diagnosis not present

## 2024-10-14 DIAGNOSIS — G4730 Sleep apnea, unspecified: Secondary | ICD-10-CM

## 2024-10-14 DIAGNOSIS — I10 Essential (primary) hypertension: Secondary | ICD-10-CM

## 2024-10-14 DIAGNOSIS — Z125 Encounter for screening for malignant neoplasm of prostate: Secondary | ICD-10-CM

## 2024-10-15 DIAGNOSIS — I10 Essential (primary) hypertension: Secondary | ICD-10-CM | POA: Insufficient documentation

## 2024-10-15 DIAGNOSIS — F17200 Nicotine dependence, unspecified, uncomplicated: Secondary | ICD-10-CM | POA: Insufficient documentation

## 2024-10-15 LAB — COMPREHENSIVE METABOLIC PANEL WITH GFR
ALT: 17 IU/L (ref 0–44)
AST: 15 IU/L (ref 0–40)
Albumin: 4.3 g/dL (ref 3.8–4.9)
Alkaline Phosphatase: 74 IU/L (ref 47–123)
BUN/Creatinine Ratio: 20 (ref 9–20)
BUN: 15 mg/dL (ref 6–24)
Bilirubin Total: 0.5 mg/dL (ref 0.0–1.2)
CO2: 23 mmol/L (ref 20–29)
Calcium: 9 mg/dL (ref 8.7–10.2)
Chloride: 105 mmol/L (ref 96–106)
Creatinine, Ser: 0.76 mg/dL (ref 0.76–1.27)
Globulin, Total: 2.6 g/dL (ref 1.5–4.5)
Glucose: 94 mg/dL (ref 70–99)
Potassium: 4 mmol/L (ref 3.5–5.2)
Sodium: 141 mmol/L (ref 134–144)
Total Protein: 6.9 g/dL (ref 6.0–8.5)
eGFR: 107 mL/min/1.73 (ref >59)

## 2024-10-15 LAB — LIPID PANEL
Chol/HDL Ratio: 5.3 ratio — ABNORMAL HIGH (ref 0.0–5.0)
Cholesterol, Total: 174 mg/dL (ref 100–199)
HDL: 33 mg/dL — ABNORMAL LOW (ref >39)
LDL Chol Calc (NIH): 110 mg/dL — ABNORMAL HIGH (ref 0–99)
Triglycerides: 173 mg/dL — ABNORMAL HIGH (ref 0–149)
VLDL Cholesterol Cal: 31 mg/dL (ref 5–40)

## 2024-10-15 LAB — PSA TOTAL (REFLEX TO FREE): Prostate Specific Ag, Serum: 0.5 ng/mL (ref 0.0–4.0)

## 2024-10-17 ENCOUNTER — Ambulatory Visit: Payer: Self-pay | Admitting: Physician Assistant

## 2025-10-20 ENCOUNTER — Encounter: Admitting: Physician Assistant
